# Patient Record
Sex: Female | Born: 1935 | Race: White | Hispanic: No | Smoking: Never smoker
Health system: Southern US, Community
[De-identification: ages and names within clinical notes are randomized; demographics above are authoritative.]

## PROBLEM LIST (undated history)

## (undated) DIAGNOSIS — R7989 Other specified abnormal findings of blood chemistry: Secondary | ICD-10-CM

## (undated) DIAGNOSIS — I4891 Unspecified atrial fibrillation: Secondary | ICD-10-CM

## (undated) DIAGNOSIS — R413 Other amnesia: Secondary | ICD-10-CM

## (undated) DIAGNOSIS — E876 Hypokalemia: Secondary | ICD-10-CM

## (undated) DIAGNOSIS — Z85038 Personal history of other malignant neoplasm of large intestine: Secondary | ICD-10-CM

## (undated) DIAGNOSIS — R778 Other specified abnormalities of plasma proteins: Secondary | ICD-10-CM

## (undated) DIAGNOSIS — I10 Essential (primary) hypertension: Secondary | ICD-10-CM

## (undated) HISTORY — PX: COLON SURGERY: SHX602

---

## 2006-10-11 ENCOUNTER — Encounter: Admission: RE | Admit: 2006-10-11 | Discharge: 2006-10-11 | Payer: Self-pay | Admitting: Internal Medicine

## 2006-10-31 ENCOUNTER — Encounter: Admission: RE | Admit: 2006-10-31 | Discharge: 2006-10-31 | Payer: Self-pay | Admitting: Internal Medicine

## 2007-02-07 ENCOUNTER — Encounter: Admission: RE | Admit: 2007-02-07 | Discharge: 2007-02-07 | Payer: Self-pay | Admitting: Internal Medicine

## 2007-02-26 ENCOUNTER — Encounter: Admission: RE | Admit: 2007-02-26 | Discharge: 2007-02-26 | Payer: Self-pay | Admitting: Obstetrics and Gynecology

## 2007-05-03 ENCOUNTER — Encounter: Admission: RE | Admit: 2007-05-03 | Discharge: 2007-05-03 | Payer: Self-pay | Admitting: Specialist

## 2007-09-12 ENCOUNTER — Encounter: Admission: RE | Admit: 2007-09-12 | Discharge: 2007-09-12 | Payer: Self-pay | Admitting: Obstetrics and Gynecology

## 2010-02-20 ENCOUNTER — Inpatient Hospital Stay (HOSPITAL_COMMUNITY): Admission: EM | Admit: 2010-02-20 | Discharge: 2010-02-24 | Payer: Self-pay | Admitting: Emergency Medicine

## 2010-11-14 ENCOUNTER — Encounter: Payer: Self-pay | Admitting: Obstetrics and Gynecology

## 2011-01-11 LAB — CBC
HCT: 29.7 % — ABNORMAL LOW (ref 36.0–46.0)
HCT: 38.9 % (ref 36.0–46.0)
Hemoglobin: 11.8 g/dL — ABNORMAL LOW (ref 12.0–15.0)
Hemoglobin: 9.8 g/dL — ABNORMAL LOW (ref 12.0–15.0)
Hemoglobin: 9.9 g/dL — ABNORMAL LOW (ref 12.0–15.0)
MCHC: 33.4 g/dL (ref 30.0–36.0)
MCHC: 33.7 g/dL (ref 30.0–36.0)
MCHC: 33.8 g/dL (ref 30.0–36.0)
MCV: 97 fL (ref 78.0–100.0)
MCV: 97.5 fL (ref 78.0–100.0)
MCV: 97 fL (ref 78.0–100.0)
Platelets: 165 10*3/uL (ref 150–400)
Platelets: 190 10*3/uL (ref 150–400)
Platelets: 196 10*3/uL (ref 150–400)
RBC: 2.98 MIL/uL — ABNORMAL LOW (ref 3.87–5.11)
RBC: 3.07 MIL/uL — ABNORMAL LOW (ref 3.87–5.11)
RBC: 3.15 MIL/uL — ABNORMAL LOW (ref 3.87–5.11)
RBC: 4.01 MIL/uL (ref 3.87–5.11)
RDW: 12.9 % (ref 11.5–15.5)
RDW: 13.3 % (ref 11.5–15.5)
WBC: 6.4 10*3/uL (ref 4.0–10.5)
WBC: 8 10*3/uL (ref 4.0–10.5)
WBC: 8.1 10*3/uL (ref 4.0–10.5)
WBC: 7.8 10*3/uL (ref 4.0–10.5)

## 2011-01-11 LAB — URINALYSIS, ROUTINE W REFLEX MICROSCOPIC
Bilirubin Urine: NEGATIVE
Nitrite: NEGATIVE
Urobilinogen, UA: 0.2 mg/dL (ref 0.0–1.0)

## 2011-01-11 LAB — PHOSPHORUS: Phosphorus: 3.8 mg/dL (ref 2.3–4.6)

## 2011-01-11 LAB — BASIC METABOLIC PANEL
BUN: 11 mg/dL (ref 6–23)
BUN: 14 mg/dL (ref 6–23)
BUN: 9 mg/dL (ref 6–23)
CO2: 27 mEq/L (ref 19–32)
CO2: 29 mEq/L (ref 19–32)
CO2: 25 mEq/L (ref 19–32)
Calcium: 7.9 mg/dL — ABNORMAL LOW (ref 8.4–10.5)
Calcium: 8.3 mg/dL — ABNORMAL LOW (ref 8.4–10.5)
Calcium: 8.4 mg/dL (ref 8.4–10.5)
Calcium: 8.6 mg/dL (ref 8.4–10.5)
Calcium: 8.8 mg/dL (ref 8.4–10.5)
Chloride: 104 mEq/L (ref 96–112)
Chloride: 104 mEq/L (ref 96–112)
Chloride: 105 mEq/L (ref 96–112)
Chloride: 103 mEq/L (ref 96–112)
Creatinine, Ser: 0.44 mg/dL (ref 0.4–1.2)
Creatinine, Ser: 0.47 mg/dL (ref 0.4–1.2)
Creatinine, Ser: 0.64 mg/dL (ref 0.4–1.2)
Creatinine, Ser: 0.66 mg/dL (ref 0.4–1.2)
GFR calc Af Amer: >60 mL/min (ref >60)
GFR calc Af Amer: >60 mL/min (ref >60)
GFR calc Af Amer: >60 mL/min (ref >60)
GFR calc non Af Amer: >60 mL/min (ref >60)
GFR calc non Af Amer: >60 mL/min (ref >60)
Glucose, Bld: 119 mg/dL — ABNORMAL HIGH (ref 70–99)
Glucose, Bld: 152 mg/dL — ABNORMAL HIGH (ref 70–99)
Glucose, Bld: 125 mg/dL — ABNORMAL HIGH (ref 70–99)
Potassium: 3.7 mEq/L (ref 3.5–5.1)
Sodium: 137 mEq/L (ref 135–145)
Sodium: 139 mEq/L (ref 135–145)
Sodium: 136 mEq/L (ref 135–145)

## 2011-01-11 LAB — DIFFERENTIAL
Eosinophils Absolute: 0.1 10*3/uL (ref 0.0–0.7)
Lymphs Abs: 0.7 10*3/uL (ref 0.7–4.0)
Monocytes Relative: 6 % (ref 3–12)
Neutrophils Relative %: 84 % — ABNORMAL HIGH (ref 43–77)

## 2011-01-11 LAB — URINE CULTURE: Colony Count: NO GROWTH

## 2011-01-11 LAB — LIPID PANEL
Cholesterol: 156 mg/dL (ref 0–200)
HDL: 70 mg/dL (ref >39)
LDL Cholesterol: 75 mg/dL (ref 0–99)
Total CHOL/HDL Ratio: 2.2 RATIO
Triglycerides: 55 mg/dL (ref <150)

## 2018-01-21 ENCOUNTER — Emergency Department (HOSPITAL_COMMUNITY): Payer: Medicare Other

## 2018-01-21 ENCOUNTER — Encounter (HOSPITAL_COMMUNITY): Payer: Self-pay | Admitting: Internal Medicine

## 2018-01-21 ENCOUNTER — Inpatient Hospital Stay (HOSPITAL_COMMUNITY)
Admission: EM | Admit: 2018-01-21 | Discharge: 2018-01-25 | DRG: 309 | Disposition: A | Discharge status: Skilled Nursing Facility | Payer: Medicare Other | Attending: Family Medicine | Admitting: Family Medicine

## 2018-01-21 ENCOUNTER — Other Ambulatory Visit: Payer: Self-pay

## 2018-01-21 DIAGNOSIS — M545 Low back pain: Secondary | ICD-10-CM | POA: Diagnosis present

## 2018-01-21 DIAGNOSIS — Y92009 Unspecified place in unspecified non-institutional (private) residence as the place of occurrence of the external cause: Secondary | ICD-10-CM

## 2018-01-21 DIAGNOSIS — I10 Essential (primary) hypertension: Secondary | ICD-10-CM | POA: Diagnosis not present

## 2018-01-21 DIAGNOSIS — Z85038 Personal history of other malignant neoplasm of large intestine: Secondary | ICD-10-CM | POA: Diagnosis not present

## 2018-01-21 DIAGNOSIS — H9190 Unspecified hearing loss, unspecified ear: Secondary | ICD-10-CM | POA: Diagnosis present

## 2018-01-21 DIAGNOSIS — Z66 Do not resuscitate: Secondary | ICD-10-CM | POA: Diagnosis present

## 2018-01-21 DIAGNOSIS — N390 Urinary tract infection, site not specified: Secondary | ICD-10-CM | POA: Diagnosis present

## 2018-01-21 DIAGNOSIS — W19XXXA Unspecified fall, initial encounter: Secondary | ICD-10-CM | POA: Diagnosis present

## 2018-01-21 DIAGNOSIS — Z9181 History of falling: Secondary | ICD-10-CM

## 2018-01-21 DIAGNOSIS — R531 Weakness: Secondary | ICD-10-CM

## 2018-01-21 DIAGNOSIS — K5900 Constipation, unspecified: Secondary | ICD-10-CM | POA: Diagnosis not present

## 2018-01-21 DIAGNOSIS — Z91013 Allergy to seafood: Secondary | ICD-10-CM

## 2018-01-21 DIAGNOSIS — R001 Bradycardia, unspecified: Secondary | ICD-10-CM | POA: Diagnosis not present

## 2018-01-21 DIAGNOSIS — I4891 Unspecified atrial fibrillation: Principal | ICD-10-CM | POA: Diagnosis present

## 2018-01-21 DIAGNOSIS — F0390 Unspecified dementia without behavioral disturbance: Secondary | ICD-10-CM | POA: Diagnosis present

## 2018-01-21 DIAGNOSIS — Z9049 Acquired absence of other specified parts of digestive tract: Secondary | ICD-10-CM | POA: Diagnosis not present

## 2018-01-21 DIAGNOSIS — E876 Hypokalemia: Secondary | ICD-10-CM | POA: Diagnosis present

## 2018-01-21 HISTORY — DX: Essential (primary) hypertension: I10

## 2018-01-21 HISTORY — DX: Other amnesia: R41.3

## 2018-01-21 HISTORY — DX: Personal history of other malignant neoplasm of large intestine: Z85.038

## 2018-01-21 LAB — URINALYSIS, ROUTINE W REFLEX MICROSCOPIC
Bilirubin Urine: NEGATIVE
GLUCOSE, UA: NEGATIVE mg/dL
Ketones, ur: NEGATIVE mg/dL
Leukocytes, UA: NEGATIVE
Nitrite: POSITIVE — AB
PROTEIN: NEGATIVE mg/dL
SPECIFIC GRAVITY, URINE: 1.005 (ref 1.005–1.030)
pH: 6 (ref 5.0–8.0)

## 2018-01-21 LAB — CBC
HEMATOCRIT: 42.9 % (ref 36.0–46.0)
Hemoglobin: 14.7 g/dL (ref 12.0–15.0)
MCH: 32.1 pg (ref 26.0–34.0)
MCHC: 34.3 g/dL (ref 30.0–36.0)
MCV: 93.7 fL (ref 78.0–100.0)
Platelets: 215 10*3/uL (ref 150–400)
RBC: 4.58 MIL/uL (ref 3.87–5.11)
RDW: 12.9 % (ref 11.5–15.5)
WBC: 8.3 10*3/uL (ref 4.0–10.5)

## 2018-01-21 LAB — BASIC METABOLIC PANEL
Anion gap: 10 (ref 5–15)
BUN: 15 mg/dL (ref 6–20)
CALCIUM: 9.2 mg/dL (ref 8.9–10.3)
CO2: 27 mmol/L (ref 22–32)
Chloride: 102 mmol/L (ref 101–111)
Creatinine, Ser: 0.55 mg/dL (ref 0.44–1.00)
GFR calc Af Amer: >60 mL/min (ref >60)
GFR calc non Af Amer: >60 mL/min (ref >60)
GLUCOSE: 123 mg/dL — AB (ref 65–99)
Potassium: 3.2 mmol/L — ABNORMAL LOW (ref 3.5–5.1)
Sodium: 139 mmol/L (ref 135–145)

## 2018-01-21 LAB — I-STAT TROPONIN, ED: Troponin i, poc: 0.02 ng/mL (ref 0.00–0.08)

## 2018-01-21 LAB — MAGNESIUM: Magnesium: 2 mg/dL (ref 1.7–2.4)

## 2018-01-21 MED ORDER — ACETAMINOPHEN 650 MG RE SUPP: 650.0000 mg | Freq: Four times a day (QID) | RECTAL | Status: DC | PRN

## 2018-01-21 MED ORDER — HALOPERIDOL LACTATE 5 MG/ML IJ SOLN
5.0000 mg | Freq: Four times a day (QID) | INTRAMUSCULAR | Status: DC | PRN
  Administered 2018-01-21 – 2018-01-23 (×3): 5 mg via INTRAVENOUS
  Filled 2018-01-21 (×4): qty 1

## 2018-01-21 MED ORDER — CEFTRIAXONE SODIUM 1 G IJ SOLR
1.0000 g | INTRAMUSCULAR | Status: DC
  Administered 2018-01-22: 1 g via INTRAVENOUS
  Filled 2018-01-21: qty 10
  Filled 2018-01-21: qty 1

## 2018-01-21 MED ORDER — BENAZEPRIL HCL 10 MG PO TABS
20.0000 mg | ORAL_TABLET | Freq: Every day | ORAL | Status: DC
  Administered 2018-01-22 – 2018-01-25 (×4): 20 mg via ORAL
  Filled 2018-01-21: qty 1
  Filled 2018-01-21 (×3): qty 2

## 2018-01-21 MED ORDER — ACETAMINOPHEN 325 MG PO TABS: 650.0000 mg | ORAL_TABLET | Freq: Four times a day (QID) | ORAL | Status: DC | PRN

## 2018-01-21 MED ORDER — POTASSIUM CHLORIDE CRYS ER 20 MEQ PO TBCR
40.0000 meq | EXTENDED_RELEASE_TABLET | Freq: Once | ORAL | Status: AC
  Administered 2018-01-21: 40 meq via ORAL
  Filled 2018-01-21: qty 2

## 2018-01-21 MED ORDER — AMLODIPINE BESYLATE 10 MG PO TABS
10.0000 mg | ORAL_TABLET | Freq: Every day | ORAL | Status: DC
  Administered 2018-01-22 – 2018-01-23 (×2): 10 mg via ORAL
  Filled 2018-01-21 (×2): qty 1

## 2018-01-21 MED ORDER — LORAZEPAM 2 MG/ML IJ SOLN
0.5000 mg | Freq: Once | INTRAMUSCULAR | Status: AC
  Administered 2018-01-22: 0.5 mg via INTRAVENOUS
  Filled 2018-01-21: qty 1

## 2018-01-21 MED ORDER — ONDANSETRON HCL 4 MG PO TABS: 4.0000 mg | ORAL_TABLET | Freq: Four times a day (QID) | ORAL | Status: DC | PRN

## 2018-01-21 MED ORDER — ENOXAPARIN SODIUM 40 MG/0.4ML ~~LOC~~ SOLN
40.0000 mg | SUBCUTANEOUS | Status: DC
  Administered 2018-01-21 – 2018-01-24 (×4): 40 mg via SUBCUTANEOUS
  Filled 2018-01-21 (×4): qty 0.4

## 2018-01-21 MED ORDER — DILTIAZEM LOAD VIA INFUSION
5.0000 mg | Freq: Once | INTRAVENOUS | Status: AC
  Administered 2018-01-21: 5 mg via INTRAVENOUS
  Filled 2018-01-21: qty 5

## 2018-01-21 MED ORDER — SODIUM CHLORIDE 0.9 % IV SOLN
1.0000 g | Freq: Once | INTRAVENOUS | Status: AC
  Administered 2018-01-21: 1 g via INTRAVENOUS
  Filled 2018-01-21: qty 10

## 2018-01-21 MED ORDER — ONDANSETRON HCL 4 MG/2ML IJ SOLN: 4.0000 mg | Freq: Four times a day (QID) | INTRAMUSCULAR | Status: DC | PRN

## 2018-01-21 MED ORDER — DILTIAZEM HCL-DEXTROSE 100-5 MG/100ML-% IV SOLN (PREMIX)
5.0000 mg/h | INTRAVENOUS | Status: DC
  Administered 2018-01-21: 5 mg/h via INTRAVENOUS
  Filled 2018-01-21: qty 100

## 2018-01-21 NOTE — ED Provider Notes (Addendum)
[facility]-EMERGENCY DEPT Provider Note   CSN: [id number] Arrival date & time: 01/21/18  1208     History   Chief Complaint Chief Complaint  Patient presents with  . Back Pain    HPI Peggy Webb is a [age] y.o. female.  HPI   Peggy Webb is a [age]yo female with a history of HTN, hard of hearing and memory loss who presents to the emergency department with her daughter for evaluation of lower back pain. Level 5 caveat applies given patient's memory loss. Per family member patient was found in the middle of the night three days ago after she had presumably missed the bed and fell on to her buttocks. She was complaining of right hip pain and her doctor made a house call and got xrays of the right hip and leg which were negative. The next day patient began complaining of lower back pain, particularly in the morning. She is able to ambulate with a walker despite her pain. They have been giving her motrin for it. Family state that they would like patient checked for a UTI because she seems to be more irritated and fussy today and this has happened in the past with UTI. They deny any recent URI illness. No fever, cough, congestion, vomiting.   No past medical history on file.  There are no active problems to display for this patient.   Past Surgical History:  Procedure Laterality Date  . COLON SURGERY     partial colectomy due to colon cancer > 15-20 years ago     OB History   None      Home Medications    Prior to Admission medications   Not on File    Family History No family history on file.  Social History Social History   Tobacco Use  . Smoking status: Not on file  Substance Use Topics  . Alcohol use: Not on file  . Drug use: Not on file     Allergies   Patient has no allergy information on record.   Review of Systems Review of Systems  Unable to perform ROS: Other     Physical Exam Updated Vital Signs BP 109/79 (BP Location:  Left Arm)   Pulse (!) 121   Temp 98.4 F (36.9 C) (Oral)   Resp (!) 21   Ht 5\' 6"  (1.676 m)   Wt 60.3 kg (132 lb 15 oz)   SpO2 96%   BMI 21.46 kg/m   Physical Exam  Constitutional: She appears well-developed and well-nourished. No distress.  Sitting at bedside in no apparent distress.   HENT:  Head: Normocephalic and atraumatic.  Mouth/Throat: Oropharynx is clear and moist. No oropharyngeal exudate.  Eyes: Pupils are equal, round, and reactive to light. Conjunctivae are normal. Right eye exhibits no discharge. Left eye exhibits no discharge.  Neck: Normal range of motion. Neck supple.  No midline cervical spine tenderness.  Cardiovascular: Intact distal pulses.  No murmur heard. Irregularly irregular rhythm, tachycardic.   Pulmonary/Chest: Effort normal and breath sounds normal. No stridor. No respiratory distress. She has no wheezes. She has no rales.  Abdominal: Soft. Bowel sounds are normal. There is no tenderness.  Musculoskeletal:  Tender to palpation over several spinous processes of the lumbar spine.   Neurological: She is alert. Coordination normal.  No apparent aphasia or facial droop. PERRL. Moving all extremities.   Skin: Skin is warm and dry. Capillary refill takes less than 2 seconds. She is not diaphoretic.  Psychiatric: She has a normal mood and affect. Her behavior is normal.  Nursing note and vitals reviewed.    ED Treatments / Results  Labs (all labs ordered are listed, but only abnormal results are displayed) Labs Reviewed  URINALYSIS, ROUTINE W REFLEX MICROSCOPIC - Abnormal; Notable for the following components:      Result Value   Hgb urine dipstick SMALL (*)    Nitrite POSITIVE (*)    Bacteria, UA MANY (*)    Squamous Epithelial / LPF 0-5 (*)    All other components within normal limits  BASIC METABOLIC PANEL - Abnormal; Notable for the following components:   Potassium 3.2 (*)    Glucose, Bld 123 (*)    All other components within normal limits    URINE CULTURE  CBC  MAGNESIUM  I-STAT TROPONIN, ED    EKG None  Radiology Dg Chest 2 View  Result Date: 01/21/2018 CLINICAL DATA:  Shortness of breath. EXAM: CHEST - 2 VIEW COMPARISON:  Radiograph of February 20, 2010. FINDINGS: Stable cardiomegaly. Atherosclerosis of thoracic aorta is noted. Stable calcified left hilar lymph node. No pneumothorax or pleural effusion is noted. No acute pulmonary disease is noted. Bony thorax is unremarkable. IMPRESSION: No active cardiopulmonary disease. Aortic Atherosclerosis (ICD10-I70.0). Electronically Signed   By: Lupita Raider, M.D.   On: 01/21/2018 17:06   Dg Lumbar Spine Complete  Result Date: 01/21/2018 CLINICAL DATA:  Acute low back pain after fall 2 days ago. EXAM: LUMBAR SPINE - COMPLETE 4+ VIEW COMPARISON:  CT scan of October 11, 2006. FINDINGS: Diffuse osteopenia is noted. No spondylolisthesis is noted. Moderate superior endplate depression of L3 vertebral body is noted most consistent with old fracture. Moderate degenerative disc disease is noted at L4-5 and L5-S1. Degenerative changes seen involving posterior facet joints of L5-S1. IMPRESSION: Probable old L3 vertebral body fracture is noted. Multilevel degenerative disc disease. No acute abnormality seen in the lumbar spine. Electronically Signed   By: Lupita Raider, M.D.   On: 01/21/2018 14:42    Procedures Procedures (including critical care time)  CRITICAL CARE Performed by: Kellie Shropshire   Total critical care time: 35 minutes  Critical care time was exclusive of separately billable procedures and treating other patients.  Critical care was necessary to treat or prevent imminent or life-threatening deterioration.  Critical care was time spent personally by me on the following activities: development of treatment plan with patient and/or surrogate as well as nursing, discussions with consultants, evaluation of patient's response to treatment, examination of patient,  obtaining history from patient or surrogate, ordering and performing treatments and interventions, ordering and review of laboratory studies, ordering and review of radiographic studies, pulse oximetry and re-evaluation of patient's condition.   Medications Ordered in ED Medications  cefTRIAXone (ROCEPHIN) 1 g in sodium chloride 0.9 % 100 mL IVPB (has no administration in time range)  potassium chloride SA (K-DUR,KLOR-CON) CR tablet 40 mEq (has no administration in time range)  diltiazem (CARDIZEM) 1 mg/mL load via infusion 5 mg (has no administration in time range)    And  diltiazem (CARDIZEM) 100 mg in dextrose 5% (1 mg/mL) infusion (has no administration in time range)     Initial Impression / Assessment and Plan / ED Course  I have reviewed the triage vital signs and the nursing notes.  Pertinent labs & imaging results that were available during my care of the patient were reviewed by me and considered in my medical decision making (see chart  for details).     Lumbar Xray with old L3 vertebral body fracture, no acute fractures noted. Patient was noted to be tachycardic with irregularly irregular rhythm on exam. Family at bedside denies history of Afib in the past. Plan to get EKG, troponin and CXR for further evaluation. Will also get basic labs and urine given report of patient being somewhat more fussy and altered than her baseline.   CBC unremarkable. BMP reveals mild hypokalemia (K+ 3.2). UA with positive nitrite with many bacteria, although low leukocytes. Borderline infection vs contamination. Culture sent. EKG reveals Afib with RVR. Patient is otherwise hemodynamically stable. Troponin negative, patient denies CP. Do not suspect ACS at this time given presentation and lab work. CXR without acute abnormality.  Discussed this patient with Dr. Alvino Chapel who will admit patient for new Afib with RVR. Patient started on Cardizem drip in the ED and also given Rocephin for UTI. This was a  shared visit with Dr. Rosalia Hammers who also saw patient and agrees with above plan and admission. Family informed.  Final Clinical Impressions(s) / ED Diagnoses   Final diagnoses:  None    ED Discharge Orders    None       Kellie Shropshire, PA-C 01/21/18 2350    Kellie Shropshire, PA-C 01/22/18 0020    Margarita Grizzle, MD 01/24/18 [phone]

## 2018-01-21 NOTE — ED Notes (Signed)
ED TO INPATIENT HANDOFF REPORT  Name/Age/Gender Peggy Webb 84 y.o. female  Code Status   Home/SNF/Other Home  Chief Complaint back pain  Level of Care/Admitting Diagnosis ED Disposition    ED Disposition Condition [facility] Area: [place] [[id number]]  Level of Care: Stepdown [14]  Admit to SDU based on following criteria: Cardiac Instability:  Patients experiencing chest pain, unconfirmed MI and stable, arrhythmias and CHF requiring medical management and potentially compromising patient's stability  Diagnosis: Atrial fibrillation with RVR [facility]) [[id number]]  Admitting Physician: Dessa Phi [[id number]]  Attending Physician: Dessa Phi [phone]  Estimated length of stay: past midnight tomorrow  Certification:: I certify this patient will need inpatient services for at least 2 midnights  PT Class (Do Not Modify): Inpatient [101]  PT Acc Code (Do Not Modify): Private [1]       Medical History Past Medical History:  Diagnosis Date  . History of colon cancer   . HTN (hypertension)   . Memory loss     Allergies Allergies  Allergen Reactions  . Shrimp [Shellfish Allergy] Nausea And Vomiting    IV Location/Drains/Wounds Patient Lines/Drains/Airways Status   Active Line/Drains/Airways    Name:   Placement date:   Placement time:   Site:   Days:   Peripheral IV 01/21/18 Left;Lateral Forearm   01/21/18    1815    Forearm   less than 1          Labs/Imaging Results for orders placed or performed during the hospital encounter of 01/21/18 (from the past 48 hour(s))  Urinalysis, Routine w reflex microscopic     Status: Abnormal   Collection Time: 01/21/18  3:19 PM  Result Value Ref Range   Color, Urine YELLOW YELLOW   APPearance CLEAR CLEAR   Specific Gravity, Urine 1.005 1.005 - 1.030   pH 6.0 5.0 - 8.0   Glucose, UA NEGATIVE NEGATIVE mg/dL   Hgb urine dipstick SMALL (A) NEGATIVE   Bilirubin Urine NEGATIVE NEGATIVE    Ketones, ur NEGATIVE NEGATIVE mg/dL   Protein, ur NEGATIVE NEGATIVE mg/dL   Nitrite POSITIVE (A) NEGATIVE   Leukocytes, UA NEGATIVE NEGATIVE   RBC / HPF 0-5 0 - 5 RBC/hpf   WBC, UA 0-5 0 - 5 WBC/hpf   Bacteria, UA MANY (A) NONE SEEN   Squamous Epithelial / LPF 0-5 (A) NONE SEEN    Comment: Performed at [facility], [place] [address] [place], [place] [address]  CBC     Status: None   Collection Time: 01/21/18  3:19 PM  Result Value Ref Range   WBC 8.3 4.0 - 10.5 K/uL   RBC 4.58 3.87 - 5.11 MIL/uL   Hemoglobin 14.7 12.0 - 15.0 g/dL   HCT 42.9 36.0 - 46.0 %   MCV 93.7 78.0 - 100.0 fL   MCH 32.1 26.0 - 34.0 pg   MCHC 34.3 30.0 - 36.0 g/dL   RDW 12.9 11.5 - 15.5 %   Platelets 215 150 - 400 K/uL    Comment: Performed at [facility], [place] [address], [place], [place] [address]  Basic metabolic panel     Status: Abnormal   Collection Time: 01/21/18  3:19 PM  Result Value Ref Range   Sodium 139 135 - 145 mmol/L   Potassium 3.2 (L) 3.5 - 5.1 mmol/L   Chloride 102 101 - 111 mmol/L   CO2 27 22 - 32 mmol/L   Glucose, Bld 123 (H) 65 -  99 mg/dL   BUN 15 6 - 20 mg/dL   Creatinine, Ser 0.55 0.44 - 1.00 mg/dL   Calcium 9.2 8.9 - 10.3 mg/dL   GFR calc non Af Amer >60 >60 mL/min   GFR calc Af Amer >60 >60 mL/min    Comment: (NOTE) The eGFR has been calculated using the CKD EPI equation. This calculation has not been validated in all clinical situations. eGFR's persistently <60 mL/min signify possible Chronic Kidney Disease.    Anion gap 10 5 - 15    Comment: Performed at [facility], [place] [address] [place], [place] [address]  Magnesium     Status: None   Collection Time: 01/21/18  3:20 PM  Result Value Ref Range   Magnesium 2.0 1.7 - 2.4 mg/dL    Comment: Performed at [facility], [place] [address] [place], [place] [address]  I-Stat Troponin, ED (not at [facility])     Status: None   Collection Time: 01/21/18  4:55 PM   Result Value Ref Range   Troponin i, poc 0.02 0.00 - 0.08 ng/mL   Comment 3            Comment: Due to the release kinetics of cTnI, a negative result within the first hours of the onset of symptoms does not rule out myocardial infarction with certainty. If myocardial infarction is still suspected, repeat the test at appropriate intervals.    Dg Chest 2 View  Result Date: 01/21/2018 CLINICAL DATA:  Shortness of breath. EXAM: CHEST - 2 VIEW COMPARISON:  Radiograph of February 20, 2010. FINDINGS: Stable cardiomegaly. Atherosclerosis of thoracic aorta is noted. Stable calcified left hilar lymph node. No pneumothorax or pleural effusion is noted. No acute pulmonary disease is noted. Bony thorax is unremarkable. IMPRESSION: No active cardiopulmonary disease. Aortic Atherosclerosis (ICD10-I70.0). Electronically Signed   By: Marijo Conception, M.D.   On: 01/21/2018 17:06   Dg Lumbar Spine Complete  Result Date: 01/21/2018 CLINICAL DATA:  Acute low back pain after fall 2 days ago. EXAM: LUMBAR SPINE - COMPLETE 4+ VIEW COMPARISON:  CT scan of October 11, 2006. FINDINGS: Diffuse osteopenia is noted. No spondylolisthesis is noted. Moderate superior endplate depression of L3 vertebral body is noted most consistent with old fracture. Moderate degenerative disc disease is noted at L4-5 and L5-S1. Degenerative changes seen involving posterior facet joints of L5-S1. IMPRESSION: Probable old L3 vertebral body fracture is noted. Multilevel degenerative disc disease. No acute abnormality seen in the lumbar spine. Electronically Signed   By: Marijo Conception, M.D.   On: 01/21/2018 14:42    Pending Labs Unresulted Labs (From admission, onward)   Start     Ordered   01/21/18 1401  Urine culture  STAT,   STAT     01/21/18 1400   Signed and Held  CBC  (enoxaparin (LOVENOX)    CrCl >/= 30 ml/min)  Once,   R    Comments:  Baseline for enoxaparin therapy IF NOT ALREADY DRAWN.  Notify MD if PLT < 100 K.    Signed and  Held   Signed and Held  Creatinine, serum  (enoxaparin (LOVENOX)    CrCl >/= 30 ml/min)  Once,   R    Comments:  Baseline for enoxaparin therapy IF NOT ALREADY DRAWN.    Signed and Held   Signed and Held  Creatinine, serum  (enoxaparin (LOVENOX)    CrCl >/= 30 ml/min)  Weekly,   R    Comments:  while on enoxaparin therapy    Signed and Held   Signed and Held  TSH  Once,   R     Signed and Held   Signed and Held  CBC  Tomorrow morning,   R     Signed and Held   Signed and Held  Basic metabolic panel  Tomorrow morning,   R     Signed and Held      Vitals/Pain Today's Vitals   01/21/18 1730 01/21/18 1800 01/21/18 1830 01/21/18 1845  BP:    109/79  Pulse: 90 (!) 147 89 (!) 121  Resp: (!) 24 20 (!) 25 (!) 21  SpO2: 99% 97% 97% 96%    Isolation Precautions No active isolations  Medications Medications  potassium chloride SA (K-DUR,KLOR-CON) CR tablet 40 mEq (has no administration in time range)  diltiazem (CARDIZEM) 1 mg/mL load via infusion 5 mg (5 mg Intravenous Bolus from Bag 01/21/18 1820)    And  diltiazem (CARDIZEM) 100 mg in dextrose 5% 1110m (1 mg/mL) infusion (5 mg/hr Intravenous New Bag/Given 01/21/18 1821)  cefTRIAXone (ROCEPHIN) 1 g in sodium chloride 0.9 % 100 mL IVPB (1 g Intravenous New Bag/Given 01/21/18 1817)    Mobility walks with device

## 2018-01-21 NOTE — ED Notes (Signed)
Bed: [id number] Expected date:  Expected time:  Means of arrival:  Comments: [age] yo fall

## 2018-01-21 NOTE — ED Triage Notes (Signed)
She is here with one of her daughters. Her daughter tells us that pt. Larey SeatFell two days ago (Thursday); and landed on her buttocks. She was seen at hospital and had x-rays (of extremities, not of her back) at that time. She is here today for lumbar area back pain which began yesterday and persists. Her daughter tells us pt. Has been able to ambulate with her walker after this fall. Pt. Is in no distress.

## 2018-01-21 NOTE — H&P (Signed)
History and Physical    BIRDELL FRASIER WJX:[id number] DOB: Jan 23, 1914 DOA: 01/21/2018  PCP: Angela Cox, MD  Patient coming from: Home  Chief Complaint: Low back pain after fall at home  HPI: Peggy Webb is a 84 y.o. female with medical history significant of HTN, hx colon cancer s/p partial colectomy in remission, memory loss who presents with low back pain. She had fallen/slid down at home a few days ago at the edge of the bed. She had an outpatient hip xray which was negative. She was initially doing well, walking with a walker but then continued to complain of low back pain and family felt she was acting more irritable/confused than normal so she was brought in to the hospital. She has memory loss although no formal diagnosis of dementia. She lives at home with daughter and son-in-law. She has no complaints on my exam; denies chest pain, shortness of breath, abdominal pain, back pain, nausea, vomiting, dysuria.   ED Course: EKG revealed new onset A Fib. UA positive for nitrite, many bacteria, 0-5 WBC. Due to A Fib RVR rate 100-140s, she was referred for admission. She is started on IV rocephin for UTI while awaiting urine culture result and IV cardizem for rate control.   Review of Systems: As per HPI otherwise 10 point review of systems negative.   Past Medical History:  Diagnosis Date  . History of colon cancer   . HTN (hypertension)   . Memory loss     Past Surgical History:  Procedure Laterality Date  . COLON SURGERY     partial colectomy due to colon cancer > 15-20 years ago     has no tobacco, alcohol, and drug history on file.  Allergies  Allergen Reactions  . Shrimp [Shellfish Allergy] Nausea And Vomiting    History reviewed. No pertinent family history.    Prior to Admission medications   Medication Sig Start Date End Date Taking? Authorizing Provider  amLODipine (NORVASC) 10 MG tablet Take 10 mg by mouth daily. 11/06/17  Yes [provider]  benazepril (LOTENSIN) 20 MG tablet Take 20 mg by mouth daily. 11/13/17  Yes [provider]  ibuprofen (ADVIL,MOTRIN) 200 MG tablet Take 400-800 mg by mouth 2 (two) times daily as needed for moderate pain.   Yes [provider]  labetalol (NORMODYNE) 200 MG tablet Take 100 mg by mouth 2 (two) times daily.   Yes [provider]    Physical Exam: Vitals:   01/21/18 1645 01/21/18 1730 01/21/18 1800 01/21/18 1830  BP:      Pulse: (!) 55 90 (!) 147 89  Resp: (!) 23 (!) 24 20 (!) 25  SpO2: 100% 99% 97% 97%    Constitutional: NAD, calm, comfortable Eyes: PERRL, lids and conjunctivae normal ENMT: Mucous membranes are moist. Posterior pharynx clear of any exudate or lesions.Normal dentition. +Hard of hearing  Neck: normal, supple, no masses, no thyromegaly Respiratory: clear to auscultation bilaterally, no wheezing, no crackles. Normal respiratory effort. No accessory muscle use.  Cardiovascular: Irregular rhythm, rate 130s, no murmurs / rubs / gallops. No extremity edema. Abdomen: no tenderness, no masses palpated. No hepatosplenomegaly. Bowel sounds positive.  Musculoskeletal: no clubbing / cyanosis. No joint deformity upper and lower extremities. Skin: no rashes, lesions, ulcers. No induration Neurologic: CN 2-12 grossly intact. Strength 5/5 in all 4.  Psychiatric: Alert. +Memory loss  Labs on Admission: I have personally reviewed following labs and imaging studies  CBC: Recent Labs  Lab 01/21/18  1519  WBC 8.3  HGB 14.7  HCT 42.9  MCV 93.7  PLT 215   Basic Metabolic Panel: Recent Labs  Lab 01/21/18 1519 01/21/18 1520  NA 139  --   K 3.2*  --   CL 102  --   CO2 27  --   GLUCOSE 123*  --   BUN 15  --   CREATININE 0.55  --   CALCIUM 9.2  --   MG  --  2.0   GFR: CrCl cannot be calculated (Unknown ideal weight.). Liver Function Tests: No results for input(s): AST, ALT, ALKPHOS, BILITOT, PROT, ALBUMIN in the last 168 hours. No results for  input(s): LIPASE, AMYLASE in the last 168 hours. No results for input(s): AMMONIA in the last 168 hours. Coagulation Profile: No results for input(s): INR, PROTIME in the last 168 hours. Cardiac Enzymes: No results for input(s): CKTOTAL, CKMB, CKMBINDEX, TROPONINI in the last 168 hours. BNP (last 3 results) No results for input(s): PROBNP in the last 8760 hours. HbA1C: No results for input(s): HGBA1C in the last 72 hours. CBG: No results for input(s): GLUCAP in the last 168 hours. Lipid Profile: No results for input(s): CHOL, HDL, LDLCALC, TRIG, CHOLHDL, LDLDIRECT in the last 72 hours. Thyroid Function Tests: No results for input(s): TSH, T4TOTAL, FREET4, T3FREE, THYROIDAB in the last 72 hours. Anemia Panel: No results for input(s): VITAMINB12, FOLATE, FERRITIN, TIBC, IRON, RETICCTPCT in the last 72 hours. Urine analysis:    Component Value Date/Time   COLORURINE YELLOW 01/21/2018 1519   APPEARANCEUR CLEAR 01/21/2018 1519   LABSPEC 1.005 01/21/2018 1519   PHURINE 6.0 01/21/2018 1519   GLUCOSEU NEGATIVE 01/21/2018 1519   HGBUR SMALL (A) 01/21/2018 1519   BILIRUBINUR NEGATIVE 01/21/2018 1519   KETONESUR NEGATIVE 01/21/2018 1519   PROTEINUR NEGATIVE 01/21/2018 1519   UROBILINOGEN 0.2 02/20/2010 1403   NITRITE POSITIVE (A) 01/21/2018 1519   LEUKOCYTESUR NEGATIVE 01/21/2018 1519   Sepsis Labs: !!!!!!!!!!!!!!!!!!!!!!!!!!!!!!!!!!!!!!!!!!!! @LABRCNTIP (procalcitonin:4,lacticidven:4) )No results found for this or any previous visit (from the past 240 hour(s)).   Radiological Exams on Admission: Dg Chest 2 View  Result Date: 01/21/2018 CLINICAL DATA:  Shortness of breath. EXAM: CHEST - 2 VIEW COMPARISON:  Radiograph of February 20, 2010. FINDINGS: Stable cardiomegaly. Atherosclerosis of thoracic aorta is noted. Stable calcified left hilar lymph node. No pneumothorax or pleural effusion is noted. No acute pulmonary disease is noted. Bony thorax is unremarkable. IMPRESSION: No active  cardiopulmonary disease. Aortic Atherosclerosis (ICD10-I70.0). Electronically Signed   By: Lupita RaiderJames  Green Jr, M.D.   On: 01/21/2018 17:06   Dg Lumbar Spine Complete  Result Date: 01/21/2018 CLINICAL DATA:  Acute low back pain after fall 2 days ago. EXAM: LUMBAR SPINE - COMPLETE 4+ VIEW COMPARISON:  CT scan of October 11, 2006. FINDINGS: Diffuse osteopenia is noted. No spondylolisthesis is noted. Moderate superior endplate depression of L3 vertebral body is noted most consistent with old fracture. Moderate degenerative disc disease is noted at L4-5 and L5-S1. Degenerative changes seen involving posterior facet joints of L5-S1. IMPRESSION: Probable old L3 vertebral body fracture is noted. Multilevel degenerative disc disease. No acute abnormality seen in the lumbar spine. Electronically Signed   By: Lupita RaiderJames  Green Jr, M.D.   On: 01/21/2018 14:42    EKG: Independently reviewed. A Fib.   Assessment/Plan Active Problems:   Atrial fibrillation with RVR (HCC)   A Fib RVR, new onset A Fib -CHADSVASC score 4  -Cardizem gtt for rate control -Discussed with family regarding anticoagulation vs aspirin in  this 84 year old patient with fall at home. Not sure that she is a great candidate for full anticoagulation, although her risk of stroke remains high. Reviewed possibly starting baby aspirin instead. Family to discuss and let us know how they would like to proceed  -Echo -Check TSH  UTI, POA -Await urine culture -Empiric Rocephin  Recent fall at home -Lumbar xray negative for acute fracture -PT OT eval  HTN -Amlodipine, lotensin  -Will hold labetalol for now until rate better controlled with cardizem gtt, can transition to PO tx once rate controlled   Hypokalemia -Replace, trend -Check Mg    DVT prophylaxis: Lovenox Code Status: DNR, reviewed with family and HCPOA at bedside  Family Communication: Discussed at bedside Disposition Plan: Pending improvement in HR, UTI, PT OT eval  Consults  called: None  Admission status: Inpatient   * I certify that at the point of admission it is my clinical judgment that the patient will require inpatient hospital care spanning beyond 2 midnights from the point of admission due to high intensity of service, high risk for further deterioration and high frequency of surveillance required.*   Noralee Stain, DO [facility] [email or web address] Password [facility] 01/21/2018, 6:43 PM

## 2018-01-22 ENCOUNTER — Encounter (HOSPITAL_COMMUNITY): Payer: Self-pay | Admitting: *Deleted

## 2018-01-22 ENCOUNTER — Other Ambulatory Visit: Payer: Self-pay

## 2018-01-22 ENCOUNTER — Inpatient Hospital Stay (HOSPITAL_COMMUNITY): Payer: Medicare Other

## 2018-01-22 DIAGNOSIS — I4891 Unspecified atrial fibrillation: Secondary | ICD-10-CM

## 2018-01-22 LAB — BASIC METABOLIC PANEL
Anion gap: 8 (ref 5–15)
BUN: 16 mg/dL (ref 6–20)
CO2: 27 mmol/L (ref 22–32)
Calcium: 8.8 mg/dL — ABNORMAL LOW (ref 8.9–10.3)
Chloride: 105 mmol/L (ref 101–111)
Creatinine, Ser: 0.53 mg/dL (ref 0.44–1.00)
GFR calc Af Amer: >60 mL/min (ref >60)
GLUCOSE: 87 mg/dL (ref 65–99)
Potassium: 3.4 mmol/L — ABNORMAL LOW (ref 3.5–5.1)
SODIUM: 140 mmol/L (ref 135–145)

## 2018-01-22 LAB — CBC
HCT: 41.1 % (ref 36.0–46.0)
Hemoglobin: 13.8 g/dL (ref 12.0–15.0)
MCH: 31.8 pg (ref 26.0–34.0)
MCHC: 33.6 g/dL (ref 30.0–36.0)
MCV: 94.7 fL (ref 78.0–100.0)
PLATELETS: 199 10*3/uL (ref 150–400)
RBC: 4.34 MIL/uL (ref 3.87–5.11)
RDW: 12.9 % (ref 11.5–15.5)
WBC: 6.3 10*3/uL (ref 4.0–10.5)

## 2018-01-22 LAB — MRSA PCR SCREENING: MRSA BY PCR: NEGATIVE

## 2018-01-22 LAB — ECHOCARDIOGRAM COMPLETE
Height: 66 in
WEIGHTICAEL: 2127 [oz_av]

## 2018-01-22 LAB — URINE CULTURE

## 2018-01-22 LAB — TSH: TSH: 3.256 u[IU]/mL (ref 0.350–4.500)

## 2018-01-22 MED ORDER — LORAZEPAM 2 MG/ML IJ SOLN
1.0000 mg | Freq: Once | INTRAMUSCULAR | Status: AC
  Administered 2018-01-22: 1 mg via INTRAVENOUS
  Filled 2018-01-22: qty 1

## 2018-01-22 MED ORDER — POTASSIUM CHLORIDE CRYS ER 20 MEQ PO TBCR
40.0000 meq | EXTENDED_RELEASE_TABLET | Freq: Once | ORAL | Status: AC
  Administered 2018-01-22: 40 meq via ORAL
  Filled 2018-01-22: qty 2

## 2018-01-22 NOTE — Progress Notes (Signed)
Late entry: Pt was in Afib at shift change with HR in the 120-150s. NP on call notified. Pts HR came down to 90s-100s with no intervention. Will continue to monitor.

## 2018-01-22 NOTE — Progress Notes (Signed)
  Echocardiogram 2D Echocardiogram has been performed.  Peggy Webb 01/22/2018, 9:47 AM

## 2018-01-22 NOTE — Progress Notes (Signed)
OT Cancellation Note  Patient Details Name: Peggy KaufmanMargaret W Moris MRN: [id number] DOB: 01/08/1914   Cancelled Treatment:    Reason Eval/Treat Not Completed: Other (comment)  Noted pt worked with PT this day and needed significant A. Will perform OT eval next day as pt likely more mobile. Lise AuerLori Chassidy [place], [place] [phone]  Einar CrowEDDING, Ival Basquez D 01/22/2018, 4:20 PM

## 2018-01-22 NOTE — Progress Notes (Signed)
PROGRESS NOTE    Peggy Webb  ZOX:[id number] DOB: [birth year] DOA: 01/21/2018 PCP: Angela Cox, MD     Brief Narrative:  Peggy Webb is a 84 y.o. female with medical history significant of HTN, hx colon cancer s/p partial colectomy in remission, memory loss who presents with low back pain. She had fallen/slid down at home a few days ago at the edge of the bed. She had an outpatient hip xray which was negative. She was initially doing well, walking with a walker but then continued to complain of low back pain and family felt she was acting more irritable/confused than normal so she was brought in to the hospital. She has memory loss although no formal diagnosis of dementia. She lives at home with daughter and son-in-law. In the ED, EKG revealed new onset A Fib. UA positive for nitrite, many bacteria, 0-5 WBC. Due to A Fib RVR rate 100-140s, she was referred for admission. She is started on IV rocephin for UTI while awaiting urine culture result and IV cardizem for rate control.   Assessment & Plan:   Active Problems:   Atrial fibrillation with RVR (HCC)   A Fib RVR, new onset A Fib -CHADSVASC score 4. Discussed with family regarding anticoagulation vs aspirin in this 84 year old patient with fall at home. Not sure that she is a great candidate for full anticoagulation, although her risk of stroke remains high. Reviewed possibly starting baby aspirin instead.  -TSH normal -Echo pending  -Rate much better controlled, now in normal sinus rhythm rate 60s, off cardizem gtt  UTI, POA -Await urine culture -Empiric Rocephin  Delirium -In setting of infection, a fib, memory loss at baseline -Improved after haldol   Recent fall at home -Lumbar xray negative for acute fracture -PT OT eval  HTN -Amlodipine, lotensin  -Will hold labetalol for now   Hypokalemia -Replace, trend    DVT prophylaxis: Lovenox Code Status: DNR Family Communication: Daughter at  bedside Disposition Plan: Pending echocardiogram, PT eval, urine culture result   Consultants:   None  Procedures:   None   Antimicrobials:  Anti-infectives (From admission, onward)   Start     Dose/Rate Route Frequency Ordered Stop   01/22/18 1800  cefTRIAXone (ROCEPHIN) 1 g in sodium chloride 0.9 % 100 mL IVPB     1 g 200 mL/hr over 30 Minutes Intravenous Every 24 hours 01/21/18 1928     01/21/18 1800  cefTRIAXone (ROCEPHIN) 1 g in sodium chloride 0.9 % 100 mL IVPB     1 g 200 mL/hr over 30 Minutes Intravenous  Once 01/21/18 1750 01/21/18 1855        Subjective: Had a rough night, very confused and agitated. Received IV ativan and haldol and has been sleeping since then. No physical complaints otherwise per family report.   Objective: Vitals:   01/22/18 0400 01/22/18 0500 01/22/18 0600 01/22/18 0743  BP: 128/69 (!) 159/52 (!) 126/51   Pulse: 62 (!) 58 (!) 56   Resp: (!) 22 (!) 21 16   Temp: 97.8 F (36.6 C)   (!) 96.6 F (35.9 C)  TempSrc: Axillary   Axillary  SpO2: 91% 100% 100%   Weight:      Height:        Intake/Output Summary (Last 24 hours) at 01/22/2018 0840 Last data filed at 01/22/2018 0500 Gross per 24 hour  Intake 350.67 ml  Output -  Net 350.67 ml   Filed Weights   01/21/18 1929  Weight: 60.3 kg (132 lb 15 oz)    Examination:  General exam: Appears calm and comfortable, sleeping soundly Respiratory system: Clear to auscultation. Respiratory effort normal. Cardiovascular system: S1 & S2 heard, RRR. No JVD, murmurs, rubs, gallops or clicks. No pedal edema. Gastrointestinal system: Abdomen is nondistended, soft and nontender. No organomegaly or masses felt. Normal bowel sounds heard. Extremities: Symmetric 5 x 5 power. Skin: No rashes, lesions or ulcers Psychiatry: +memory loss  Data Reviewed: I have personally reviewed following labs and imaging studies  CBC: Recent Labs  Lab 01/21/18 1519 01/22/18 0620  WBC 8.3 6.3  HGB 14.7 13.8  HCT  42.9 41.1  MCV 93.7 94.7  PLT 215 199   Basic Metabolic Panel: Recent Labs  Lab 01/21/18 1519 01/21/18 1520 01/22/18 0620  NA 139  --  140  K 3.2*  --  3.4*  CL 102  --  105  CO2 27  --  27  GLUCOSE 123*  --  87  BUN 15  --  16  CREATININE 0.55  --  0.53  CALCIUM 9.2  --  8.8*  MG  --  2.0  --    GFR: Estimated Creatinine Clearance: 33.3 mL/min (by C-G formula based on SCr of 0.53 mg/dL). Liver Function Tests: No results for input(s): AST, ALT, ALKPHOS, BILITOT, PROT, ALBUMIN in the last 168 hours. No results for input(s): LIPASE, AMYLASE in the last 168 hours. No results for input(s): AMMONIA in the last 168 hours. Coagulation Profile: No results for input(s): INR, PROTIME in the last 168 hours. Cardiac Enzymes: No results for input(s): CKTOTAL, CKMB, CKMBINDEX, TROPONINI in the last 168 hours. BNP (last 3 results) No results for input(s): PROBNP in the last 8760 hours. HbA1C: No results for input(s): HGBA1C in the last 72 hours. CBG: No results for input(s): GLUCAP in the last 168 hours. Lipid Profile: No results for input(s): CHOL, HDL, LDLCALC, TRIG, CHOLHDL, LDLDIRECT in the last 72 hours. Thyroid Function Tests: Recent Labs    01/22/18 0620  TSH 3.256   Anemia Panel: No results for input(s): VITAMINB12, FOLATE, FERRITIN, TIBC, IRON, RETICCTPCT in the last 72 hours. Sepsis Labs: No results for input(s): PROCALCITON, LATICACIDVEN in the last 168 hours.  Recent Results (from the past 240 hour(s))  MRSA PCR Screening     Status: None   Collection Time: 01/22/18  6:27 AM  Result Value Ref Range Status   MRSA by PCR NEGATIVE NEGATIVE Final    Comment:        The GeneXpert MRSA Assay (FDA approved for NASAL specimens only), is one component of a comprehensive MRSA colonization surveillance program. It is not intended to diagnose MRSA infection nor to guide or monitor treatment for MRSA infections. Performed at [facility], [address] [place], [place] [address]        Radiology Studies: Dg Chest 2 View  Result Date: 01/21/2018 CLINICAL DATA:  Shortness of breath. EXAM: CHEST - 2 VIEW COMPARISON:  Radiograph of February 20, 2010. FINDINGS: Stable cardiomegaly. Atherosclerosis of thoracic aorta is noted. Stable calcified left hilar lymph node. No pneumothorax or pleural effusion is noted. No acute pulmonary disease is noted. Bony thorax is unremarkable. IMPRESSION: No active cardiopulmonary disease. Aortic Atherosclerosis (ICD10-I70.0). Electronically Signed   By: Lupita RaiderJames  Green Jr, M.D.   On: 01/21/2018 17:06   Dg Lumbar Spine Complete  Result Date: 01/21/2018 CLINICAL DATA:  Acute low back pain after fall 2 days ago. EXAM: LUMBAR SPINE -  COMPLETE 4+ VIEW COMPARISON:  CT scan of October 11, 2006. FINDINGS: Diffuse osteopenia is noted. No spondylolisthesis is noted. Moderate superior endplate depression of L3 vertebral body is noted most consistent with old fracture. Moderate degenerative disc disease is noted at L4-5 and L5-S1. Degenerative changes seen involving posterior facet joints of L5-S1. IMPRESSION: Probable old L3 vertebral body fracture is noted. Multilevel degenerative disc disease. No acute abnormality seen in the lumbar spine. Electronically Signed   By: Lupita Raider, M.D.   On: 01/21/2018 14:42      Scheduled Meds: . amLODipine  10 mg Oral Daily  . benazepril  20 mg Oral Daily  . enoxaparin (LOVENOX) injection  40 mg Subcutaneous Q24H  . potassium chloride  40 mEq Oral Once   Continuous Infusions: . cefTRIAXone (ROCEPHIN)  IV    . diltiazem (CARDIZEM) infusion Stopped (01/22/18 0251)     LOS: 1 day    Time spent: 30 minutes   Noralee Stain, DO [facility] [email or web address] Password [email or web address] 01/22/2018, 8:40 AM

## 2018-01-22 NOTE — Evaluation (Signed)
Physical Therapy Evaluation Patient Details Name: Peggy KaufmanMargaret W Webb MRN: [id number] DOB: 08/16/1914 Today's Date: 01/22/2018   History of Present Illness  [age] yo female admitted with Afib with RVR, recent fall, L3 vertebral fx.   Clinical Impression  On eval, pt required Mod assist for mobility. Pt was able to stand with significant assistance and use of a RW however, she was unable to take any pivotal or ambulatory steps. Pt c/o low back/side pain with standing. She would request to sit back down once pain hit her. Family (son n law) present during session. He stated family may be interested in placement. Will recommend SNF at this time. Will follow and progress activity as tolerated.     Follow Up Recommendations SNF    Equipment Recommendations  None recommended by PT    Recommendations for Other Services       Precautions / Restrictions Precautions Precautions: Fall Restrictions Weight Bearing Restrictions: No      Mobility  Bed Mobility Overal bed mobility: Needs Assistance Bed Mobility: Supine to Sit;Sit to Supine     Supine to sit: HOB elevated;Mod assist Sit to supine: HOB elevated;Mod assist   General bed mobility comments: Assist for trunk and bil LEs. Utilized bedpad for scooting, positioning. Cues for safety, instructions for task.   Transfers Overall transfer level: Needs assistance Equipment used: Rolling walker (2 wheeled) Transfers: Sit to/from Stand Sit to Stand: Mod assist;From elevated surface         General transfer comment: Attempted x 4. Pt was only able to fully stand x 1. Heavy posterior bias. Unable to safely attempt and pivotal or ambuatory steps at this time.   Ambulation/Gait             General Gait Details: NT on today.   Stairs            Wheelchair Mobility    Modified Rankin (Stroke Patients Only)       Balance Overall balance assessment: Needs assistance   Sitting balance-Leahy Scale: Fair   Postural control:  Posterior lean Standing balance support: Bilateral upper extremity supported Standing balance-Leahy Scale: Poor                               Pertinent Vitals/Pain Pain Assessment: Faces Faces Pain Scale: Hurts even more Pain Location: low back with activity Pain Descriptors / Indicators: Grimacing;Guarding;Sharp Pain Intervention(s): Limited activity within patient's tolerance;Repositioned    Home Living Family/patient expects to be discharged to:: Unsure Living Arrangements: Children Available Help at Discharge: Family;Available 24 hours/day Type of Home: House Home Access: Ramped entrance     Home Layout: One level Home Equipment: Walker - 2 wheels      Prior Function Level of Independence: Needs assistance   Gait / Transfers Assistance Needed: ambulatory with a RW  ADL's / Homemaking Assistance Needed: family/aide assisted with bathng, dressing        Hand Dominance        Extremity/Trunk Assessment   Upper Extremity Assessment Upper Extremity Assessment: Generalized weakness    Lower Extremity Assessment Lower Extremity Assessment: Generalized weakness    Cervical / Trunk Assessment Cervical / Trunk Assessment: Kyphotic  Communication   Communication: HOH  Cognition Arousal/Alertness: Awake/alert Behavior During Therapy: WFL for tasks assessed/performed Overall Cognitive Status: History of cognitive impairments - at baseline  General Comments      Exercises     Assessment/Plan    PT Assessment Patient needs continued PT services  PT Problem List Decreased strength;Decreased mobility;Decreased activity tolerance;Decreased balance;Pain;Decreased cognition;Decreased safety awareness       PT Treatment Interventions DME instruction;Gait training;Therapeutic activities;Therapeutic exercise;Patient/family education;Functional mobility training;Balance training    PT Goals (Current  goals can be found in the Care Plan section)  Acute Rehab PT Goals Patient Stated Goal: family wants rehab PT Goal Formulation: With family Time For Goal Achievement: 02/05/18 Potential to Achieve Goals: Good    Frequency Min 3X/week   Barriers to discharge        Co-evaluation               AM-PAC PT "6 Clicks" Daily Activity  Outcome Measure Difficulty turning over in bed (including adjusting bedclothes, sheets and blankets)?: Unable Difficulty moving from lying on back to sitting on the side of the bed? : Unable Difficulty sitting down on and standing up from a chair with arms (e.g., wheelchair, bedside commode, etc,.)?: Unable Help needed moving to and from a bed to chair (including a wheelchair)?: A Lot Help needed walking in hospital room?: A Lot Help needed climbing 3-5 steps with a railing? : Total 6 Click Score: 8    End of Session   Activity Tolerance: Patient limited by pain Patient left: in bed;with bed alarm set;with family/visitor present   PT Visit Diagnosis: History of falling (Z91.81);Difficulty in walking, not elsewhere classified (R26.2);Other abnormalities of gait and mobility (R26.89);Muscle weakness (generalized) (M62.81)    Time: 1610-9604 PT Time Calculation (min) (ACUTE ONLY): 34 min   Charges:   PT Evaluation $PT Eval Moderate Complexity: 1 Mod PT Treatments $Therapeutic Activity: 8-22 mins   PT G Codes:          Rebeca Alert, MPT Pager: [phone]

## 2018-01-22 NOTE — Progress Notes (Signed)
Notified NP pt pulling at tubing, trying to crawl out of bed. New orders noted and received.

## 2018-01-23 LAB — BASIC METABOLIC PANEL
Anion gap: 8 (ref 5–15)
BUN: 14 mg/dL (ref 6–20)
CALCIUM: 8.9 mg/dL (ref 8.9–10.3)
CO2: 28 mmol/L (ref 22–32)
CREATININE: 0.54 mg/dL (ref 0.44–1.00)
Chloride: 102 mmol/L (ref 101–111)
GFR calc Af Amer: >60 mL/min (ref >60)
GLUCOSE: 127 mg/dL — AB (ref 65–99)
Potassium: 3.6 mmol/L (ref 3.5–5.1)
Sodium: 138 mmol/L (ref 135–145)

## 2018-01-23 LAB — MAGNESIUM: Magnesium: 1.9 mg/dL (ref 1.7–2.4)

## 2018-01-23 MED ORDER — METOPROLOL TARTRATE 5 MG/5ML IV SOLN
5.0000 mg | Freq: Once | INTRAVENOUS | Status: AC
  Administered 2018-01-23: 5 mg via INTRAVENOUS

## 2018-01-23 MED ORDER — SODIUM CHLORIDE 0.9 % IV BOLUS
500.0000 mL | Freq: Once | INTRAVENOUS | Status: AC
  Administered 2018-01-23: 500 mL via INTRAVENOUS

## 2018-01-23 MED ORDER — POLYETHYLENE GLYCOL 3350 17 G PO PACK
17.0000 g | PACK | Freq: Every day | ORAL | Status: DC | PRN
Start: 2018-01-23 — End: 2018-01-25
  Administered 2018-01-23: 17 g via ORAL
  Filled 2018-01-23: qty 1

## 2018-01-23 MED ORDER — LABETALOL HCL 100 MG PO TABS
100.0000 mg | ORAL_TABLET | Freq: Two times a day (BID) | ORAL | Status: DC
  Administered 2018-01-23 (×2): 100 mg via ORAL
  Filled 2018-01-23 (×2): qty 1

## 2018-01-23 MED ORDER — ASPIRIN 81 MG PO CHEW
81.0000 mg | CHEWABLE_TABLET | Freq: Every day | ORAL | Status: DC
  Administered 2018-01-23 – 2018-01-25 (×3): 81 mg via ORAL
  Filled 2018-01-23 (×3): qty 1

## 2018-01-23 MED ORDER — FOSFOMYCIN TROMETHAMINE 3 G PO PACK
3.0000 g | PACK | Freq: Once | ORAL | Status: AC
  Administered 2018-01-23: 3 g via ORAL
  Filled 2018-01-23: qty 3

## 2018-01-23 MED ORDER — METOPROLOL TARTRATE 5 MG/5ML IV SOLN
INTRAVENOUS | Status: AC
  Filled 2018-01-23: qty 5

## 2018-01-23 NOTE — Progress Notes (Signed)
Occupational Therapy Evaluation Patient Details Name: Peggy KaufmanMargaret W Webb MRN: [id number] DOB: 09/12/1914 Today's Date: 01/23/2018    History of Present Illness [age] yo female admitted with Afib with RVR, recent fall, L3 vertebral fx.    Clinical Impression   Pt was admitted for the above. At baseline, she lives [place] daughter and [place]. She often performs ADLs without assistance but they will help her as needed.  Pt has back pain which limits her ADLs.  She was able to sit EOB for several minutes and stand briefly. Will follow in acute care for adls and mobility to support caregiver in assisting with ADLs    Follow Up Recommendations  SNF    Equipment Recommendations  (to be further assessed:  cannot use 3:1 yet)    Recommendations for Other Services       Precautions / Restrictions Precautions Precautions: Fall Restrictions Weight Bearing Restrictions: No      Mobility Bed Mobility     Rolling: Max assist Sidelying to sit: Mod assist;Max assist   Sit to supine: Max assist;HOB elevated   General bed mobility comments: pt had difficulty keeping LLE bent to help with rolling.  Used rail to help herself into sit from sidelying. Raised HOB to get her back to bed with +1 assist  Transfers   Equipment used: Rolling walker (2 wheeled) Transfers: Sit to/from Stand Sit to Stand: Mod assist         General transfer comment: assist to rise and stabilize. Stood approximately 10 seconds    Balance     Sitting balance-Leahy Scale: Fair   Postural control: Posterior lean(as she fatiqued) Standing balance support: Bilateral upper extremity supported Standing balance-Leahy Scale: Poor                             ADL either performed or assessed with clinical judgement   ADL Overall ADL's : Needs assistance/impaired Eating/Feeding: Maximal assistance;Sitting   Grooming: Wash/dry hands;Wash/dry face;Moderate assistance;Sitting   Upper Body Bathing: Maximal  assistance;Sitting   Lower Body Bathing: Total assistance;Sit to/from stand   Upper Body Dressing : Maximal assistance;Sitting   Lower Body Dressing: Total assistance;Sit to/from stand                 General ADL Comments: pt sat at EOB for several minutes and performed grooming. Pt states that back hurts when she sits too long. Stood with RW x 10 seconds; able to move chux pad under her.     Vision Baseline Vision/History: Wears glasses       Perception     Praxis      Pertinent Vitals/Pain Faces Pain Scale: Hurts little more Pain Location: low back with activity Pain Descriptors / Indicators: Sore Pain Intervention(s): Limited activity within patient's tolerance;Monitored during session;Repositioned     Hand Dominance     Extremity/Trunk Assessment Upper Extremity Assessment Upper Extremity Assessment: Generalized weakness           Communication Communication Communication: HOH   Cognition Arousal/Alertness: Awake/alert Behavior During Therapy: WFL for tasks assessed/performed Overall Cognitive Status: History of cognitive impairments - at baseline                                 General Comments: HOH despite hearing aides; multimodal cues   General Comments       Exercises     Shoulder Instructions  Home Living Family/patient expects to be discharged to:: Skilled nursing facility Living Arrangements: Children                                      Prior Functioning/Environment Level of Independence: Needs assistance        Comments: family would aide with adls as needed.  Did not always need help        OT Problem List: Decreased strength;Decreased activity tolerance;Impaired balance (sitting and/or standing);Pain;Decreased knowledge of precautions      OT Treatment/Interventions: Self-care/ADL training;DME and/or AE instruction;Patient/family education;Balance training;Therapeutic activities    OT  Goals(Current goals can be found in the care plan section) Acute Rehab OT Goals Patient Stated Goal: family wants rehab OT Goal Formulation: With patient/family Time For Goal Achievement: 02/06/18 Potential to Achieve Goals: Good ADL Goals Pt Will Transfer to Toilet: with mod assist;with +2 assist;bedside commode;stand pivot transfer Additional ADL Goal #1: Pt will sit eob with min guard assist and perform light adl activity Additional ADL Goal #2: pt will go from sit to stand for adls with min A and maintain x 1 minute at this level  OT Frequency: Min 2X/week   Barriers to D/C:            Co-evaluation              AM-PAC PT "6 Clicks" Daily Activity     Outcome Measure Help from another person eating meals?: A Lot Help from another person taking care of personal grooming?: A Lot Help from another person toileting, which includes using toliet, bedpan, or urinal?: A Lot Help from another person bathing (including washing, rinsing, drying)?: A Lot Help from another person to put on and taking off regular upper body clothing?: A Lot Help from another person to put on and taking off regular lower body clothing?: Total 6 Click Score: 11   End of Session    Activity Tolerance: Patient limited by pain Patient left: in bed;with call bell/phone within reach  OT Visit Diagnosis: Muscle weakness (generalized) (M62.81);Unsteadiness on feet (R26.81)                Time: 1349-1410 OT Time Calculation (min): 21 min Charges:  OT General Charges $OT Visit: 1 Visit OT Evaluation $OT Eval Low Complexity: 1 Low G-Codes:     [place], OTR/L [phone] 01/23/2018  Alisa Stjames 01/23/2018, 2:49 PM

## 2018-01-23 NOTE — Progress Notes (Addendum)
Pt HR sustaining in the 130s-160s. NP on call notified. Awaiting orders. Will continue to monitor Pt.   --Orders for 5mg  lopressor IV and a 500ml bolus given. Pt HR is now 80s-70s. Will continue to monitor.

## 2018-01-23 NOTE — [place] FL2 (Signed)
[place] MEDICAID FL2 LEVEL OF CARE SCREENING TOOL     IDENTIFICATION  Patient Name: ZAARA SPROWL Birthdate: 12-23-1913 Sex: female Admission Date (Current Location): 01/21/2018  [facility] and [place] Number:  Producer, television/film/video and Address:  [facility]  [address] [place], [place] [address]      Provider Number: [id number]  Attending Physician Name and Address:  Noralee Stain, DO  Relative Name and Phone Number:       Current Level of Care: Hospital Recommended Level of Care: Skilled Nursing Facility Prior Approval Number:    Date Approved/Denied:   PASRR Number: [id number]  Discharge Plan: SNF    Current Diagnoses: Patient Active Problem List   Diagnosis Date Noted  . Atrial fibrillation with RVR (HCC) 01/21/2018    Orientation RESPIRATION BLADDER Height & Weight     Self  Normal Incontinent Weight: 132 lb 15 oz (60.3 kg) Height:  5\' 6"  (167.6 cm)  BEHAVIORAL SYMPTOMS/MOOD NEUROLOGICAL BOWEL NUTRITION STATUS        Diet(Heart)  AMBULATORY STATUS COMMUNICATION OF NEEDS Skin   Extensive Assist Verbally Normal                       Personal Care Assistance Level of Assistance  Bathing, Feeding, Dressing Bathing Assistance: Maximum assistance Feeding assistance: Independent Dressing Assistance: Maximum assistance     Functional Limitations Info  Sight, Hearing, Speech Sight Info: Adequate Hearing Info: Impaired Speech Info: Adequate    SPECIAL CARE FACTORS FREQUENCY  PT (By licensed PT), OT (By licensed OT)     PT Frequency: 5x/week OT Frequency: 5x/week            Contractures      Additional Factors Info  Code Status, Allergies Code Status Info: DNR Allergies Info: Shrimp Shellfish Allergy           Current Medications (01/23/2018):  This is the current hospital active medication list Current Facility-Administered Medications  Medication Dose Route Frequency Provider Last Rate Last Dose  . acetaminophen  (TYLENOL) tablet 650 mg  650 mg Oral Q6H PRN Noralee Stain, DO       Or  . acetaminophen (TYLENOL) suppository 650 mg  650 mg Rectal Q6H PRN Noralee Stain, DO      . amLODipine (NORVASC) tablet 10 mg  10 mg Oral Daily Noralee Stain, DO   10 mg at 01/23/18 0957  . aspirin chewable tablet 81 mg  81 mg Oral Daily Noralee Stain, DO   81 mg at 01/23/18 0956  . benazepril (LOTENSIN) tablet 20 mg  20 mg Oral Daily Noralee Stain, DO   20 mg at 01/23/18 0956  . enoxaparin (LOVENOX) injection 40 mg  40 mg Subcutaneous Q24H Noralee Stain, DO   40 mg at 01/22/18 2109  . haloperidol lactate (HALDOL) injection 5 mg  5 mg Intravenous Q6H PRN Noralee Stain, DO   5 mg at 01/23/18 0209  . labetalol (NORMODYNE) tablet 100 mg  100 mg Oral BID Noralee Stain, DO   100 mg at 01/23/18 0859  . ondansetron (ZOFRAN) tablet 4 mg  4 mg Oral Q6H PRN Noralee Stain, DO       Or  . ondansetron [facility]) injection 4 mg  4 mg Intravenous Q6H PRN Noralee Stain, DO      . polyethylene glycol (MIRALAX / GLYCOLAX) packet 17 g  17 g Oral Daily PRN Noralee Stain, DO   17 g at 01/23/18 0956     Discharge  Medications: Please see discharge summary for a list of discharge medications.  Relevant Imaging Results:  Relevant Lab Results:   Additional Information SSN  [id number]  Antionette PolesKimberly L Jerriann Schrom, LCSW

## 2018-01-23 NOTE — Clinical Social Work Note (Signed)
Clinical Social Work Assessment  Patient Details  Name: Peggy KaufmanMargaret W Bissette MRN: [id number] Date of Birth: 04/13/1914  Date of referral:  01/23/18               Reason for consult:  Facility Placement                Permission sought to share information with:    Permission granted to share information::     Name::        Agency::     Relationship::     Contact Information:     Housing/Transportation Living arrangements for the past 2 months:  Single Family Home Source of Information:  Adult Children(Daughter - Peggy BallerDebra Webb  Son-in-law - Beckey Downingwight Webb) Patient Interpreter Needed:  None Criminal Activity/Legal Involvement Pertinent to Current Situation/Hospitalization:  No - Comment as needed Significant Relationships:  Adult Children Lives with:  Adult Children Do you feel safe going back to the place where you live?  (PT recommending SNF) Need for family participation in patient care:  Yes (Comment)  Care giving concerns:  Patient from home with daughter and son-in-law. Patient's son in law reported that patient has been staying with them for the past 13 years. Patient's son-in-law reported that patient uses a walker and requires some assistance with ADLs at times in the home. Patient's son reported that patient was able to go to the restroom by herself. Patient admitted after having a fall at home. PT recommending SNF.   Social Worker assessment / plan:  CSW spoke with patient's son in law regarding patient's discharge planning, patient asleep. Patient's son-in-law reported that they are interested in [facility] for ST rehab. CSW explained SNF placement process and medicare coverage, patient's son-in-law verbalized understanding. CSW inquired if patient had a HCPOA, patient's son-in-law reported that patient's daughter is patient's HCPOA and POA. CSW agreed to follow up with patient's daughter to confirm plan for patient to dc to SNF for ST rehab.  CSW spoke with patient's  daughter Peggy Webb(Peggy Webb [phone]) who confirmed plan for patient to dc to SNF for ST rehab. Patient's daughter reported that they prefer a private room at [facility]. CSW agreed to follow up with [facility] to see if they have a room available for patient.  CSW contacted [facility] to inquire about availability for patient. Staff member Everardo PacificKenisha reported that they will have a semiprivate room available for patient tomorrow.   CSW updated patient's son-in-law.  CSW will continue to follow and assist with discharge planning.  Employment status:  Retired Health and safety inspectornsurance information:  Medicare PT Recommendations:  Skilled Nursing Facility Information / Referral to community resources:  Skilled Nursing Facility  Patient/Family's Response to care:  Patient's daughter and patient's son in Chief Operating Officerlaw appreciative of CSW assistance with discharge planning.   Patient/Family's Understanding of and Emotional Response to Diagnosis, Current Treatment, and Prognosis:  Patient asleep, patient's son in law involved in patient's care and verbalized understanding of patient's diagnosis and current treatment. Patient's daughter and son-in-law verbalized plan for patient to dc to SNF for ST rehab. Patient's son-in-law hopeful that patient will return to baseline of functioning and return home after rehab.   Emotional Assessment Appearance:  Appears stated age Attitude/Demeanor/Rapport:  Unable to Assess Affect (typically observed):  Unable to Assess Orientation:  Oriented to Self Alcohol / Substance use:  Not Applicable Psych involvement (Current and /or in the community):  No (Comment)  Discharge Needs  Concerns to be addressed:  Care Coordination Readmission within the last 30 days:  No Current discharge risk:  Physical Impairment Barriers to Discharge:  Continued Medical Work up   [employer], LCSW 01/23/2018, 2:22 PM

## 2018-01-23 NOTE — Progress Notes (Addendum)
PROGRESS NOTE    Peggy Webb Aries  ZOX:[id number] DOB: 01/10/1914 DOA: 01/21/2018 PCP: Angela Coxasanayaka, Gayani Y, MD     Brief Narrative:  Peggy Webb Vowels is a 84 y.o. female with medical history significant of HTN, hx colon cancer s/p partial colectomy in remission, memory loss who presents with low back pain. She had fallen/slid down at home a few days ago at the edge of the bed. She had an outpatient hip xray which was negative. She was initially doing well, walking with a walker but then continued to complain of low back pain and family felt she was acting more irritable/confused than normal so she was brought in to the hospital. She has memory loss although no formal diagnosis of dementia. She lives at home with daughter and son-in-law. In the ED, EKG revealed new onset A Fib. UA positive for nitrite, many bacteria, 0-5 WBC. Due to A Fib RVR rate 100-140s, she was referred for admission. She is started on IV rocephin for UTI while awaiting urine culture result and IV cardizem for rate control.   Assessment & Plan:   Active Problems:   Atrial fibrillation with RVR (HCC)   A Fib RVR, new onset A Fib -CHADSVASC score 4. Discussed with family regarding anticoagulation vs aspirin in this 84 year old patient with fall at home. Not sure that she is a great candidate for full anticoagulation, although her risk of stroke remains high. Family okay with starting baby aspirin for stroke prevention  -TSH normal -Echo showed EF 50-55%, grade 2 dd  -Off cardizem gtt, remained in sinus bradycardia yesterday then went into A fib RVR overnight. Resume home labetaolol   UTI, POA -Urine culture with contaminant -Was treated with IV rocephin. Will give 1 dose fosfomycin.   Delirium -In setting of infection, a fib, memory loss at baseline -Improved after haldol   Recent fall at home -Lumbar xray negative for acute fracture -PT OT recommending SNF   HTN -Amlodipine, lotensin, labetalol    DVT  prophylaxis: Lovenox Code Status: DNR Family Communication: Son in Social workerlaw at bedside, also called daughter for update  Disposition Plan: SNF once HR consistently controlled on oral regimen    Consultants:   None  Procedures:   None   Antimicrobials:  Anti-infectives (From admission, onward)   Start     Dose/Rate Route Frequency Ordered Stop   01/22/18 1800  cefTRIAXone (ROCEPHIN) 1 g in sodium chloride 0.9 % 100 mL IVPB  Status:  Discontinued     1 g 200 mL/hr over 30 Minutes Intravenous Every 24 hours 01/21/18 1928 01/23/18 0940   01/21/18 1800  cefTRIAXone (ROCEPHIN) 1 g in sodium chloride 0.9 % 100 mL IVPB     1 g 200 mL/hr over 30 Minutes Intravenous  Once 01/21/18 1750 01/21/18 1855       Subjective: Constipated, no BM for days. No other acute complaints, denies chest pain, SOB, abdominal pain.   Objective: Vitals:   01/23/18 0400 01/23/18 0859 01/23/18 0927 01/23/18 1046  BP: 127/63  (!) 114/91   Pulse: 99 (!) 142 (!) 57 75  Resp: 16  18   Temp: 97.8 F (36.6 C)  98.3 F (36.8 C)   TempSrc: Axillary  Oral   SpO2: 97%  97%   Weight:      Height:        Intake/Output Summary (Last 24 hours) at 01/23/2018 1240 Last data filed at 01/23/2018 0900 Gross per 24 hour  Intake 660 ml  Output  750 ml  Net -90 ml   Filed Weights   01/21/18 1929  Weight: 60.3 kg (132 lb 15 oz)   Examination: General exam: Appears calm and comfortable  Respiratory system: Clear to auscultation. Respiratory effort normal. Cardiovascular system: S1 & S2 heard, irreg rhythm rate 120s. No JVD, murmurs, rubs, gallops or clicks. No pedal edema. Gastrointestinal system: Abdomen is nondistended, soft and nontender. No organomegaly or masses felt. Normal bowel sounds heard. Central nervous system: Alert  Extremities: Symmetric 5 x 5 power. Skin: No rashes, lesions or ulcers Psychiatry: +Memory loss   Data Reviewed: I have personally reviewed following labs and imaging studies  CBC: Recent  Labs  Lab 01/21/18 1519 01/22/18 0620  WBC 8.3 6.3  HGB 14.7 13.8  HCT 42.9 41.1  MCV 93.7 94.7  PLT 215 199   Basic Metabolic Panel: Recent Labs  Lab 01/21/18 1519 01/21/18 1520 01/22/18 0620 01/23/18 0530  NA 139  --  140 138  K 3.2*  --  3.4* 3.6  CL 102  --  105 102  CO2 27  --  27 28  GLUCOSE 123*  --  87 127*  BUN 15  --  16 14  CREATININE 0.55  --  0.53 0.54  CALCIUM 9.2  --  8.8* 8.9  MG  --  2.0  --  1.9   GFR: Estimated Creatinine Clearance: 33.3 mL/min (by C-G formula based on SCr of 0.54 mg/dL). Liver Function Tests: No results for input(s): AST, ALT, ALKPHOS, BILITOT, PROT, ALBUMIN in the last 168 hours. No results for input(s): LIPASE, AMYLASE in the last 168 hours. No results for input(s): AMMONIA in the last 168 hours. Coagulation Profile: No results for input(s): INR, PROTIME in the last 168 hours. Cardiac Enzymes: No results for input(s): CKTOTAL, CKMB, CKMBINDEX, TROPONINI in the last 168 hours. BNP (last 3 results) No results for input(s): PROBNP in the last 8760 hours. HbA1C: No results for input(s): HGBA1C in the last 72 hours. CBG: No results for input(s): GLUCAP in the last 168 hours. Lipid Profile: No results for input(s): CHOL, HDL, LDLCALC, TRIG, CHOLHDL, LDLDIRECT in the last 72 hours. Thyroid Function Tests: Recent Labs    01/22/18 0620  TSH 3.256   Anemia Panel: No results for input(s): VITAMINB12, FOLATE, FERRITIN, TIBC, IRON, RETICCTPCT in the last 72 hours. Sepsis Labs: No results for input(s): PROCALCITON, LATICACIDVEN in the last 168 hours.  Recent Results (from the past 240 hour(s))  Urine culture     Status: Abnormal   Collection Time: 01/21/18  3:19 PM  Result Value Ref Range Status   Specimen Description   Final    URINE, RANDOM Performed at [facility], [address] [place], [place] [address]    Special Requests   Final    NONE Performed at [facility], [address], [place], [place] [address]    Culture MULTIPLE SPECIES PRESENT, SUGGEST RECOLLECTION (A)  Final   Report Status 01/22/2018 FINAL  Final  MRSA PCR Screening     Status: None   Collection Time: 01/22/18  6:27 AM  Result Value Ref Range Status   MRSA by PCR NEGATIVE NEGATIVE Final    Comment:        The GeneXpert MRSA Assay (FDA approved for NASAL specimens only), is one component of a comprehensive MRSA colonization surveillance program. It is not intended to diagnose MRSA infection nor to guide or monitor treatment for MRSA infections. Performed at [facility], [address], [place], [place] [address]        Radiology Studies: Dg Chest 2 View  Result Date: 01/21/2018 CLINICAL DATA:  Shortness of breath. EXAM: CHEST - 2 VIEW COMPARISON:  Radiograph of February 20, 2010. FINDINGS: Stable cardiomegaly. Atherosclerosis of thoracic aorta is noted. Stable calcified left hilar lymph node. No pneumothorax or pleural effusion is noted. No acute pulmonary disease is noted. Bony thorax is unremarkable. IMPRESSION: No active cardiopulmonary disease. Aortic Atherosclerosis (ICD10-I70.0). Electronically Signed   By: Lupita Raider, M.D.   On: 01/21/2018 17:06   Dg Lumbar Spine Complete  Result Date: 01/21/2018 CLINICAL DATA:  Acute low back pain after fall 2 days ago. EXAM: LUMBAR SPINE - COMPLETE 4+ VIEW COMPARISON:  CT scan of October 11, 2006. FINDINGS: Diffuse osteopenia is noted. No spondylolisthesis is noted. Moderate superior endplate depression of L3 vertebral body is noted most consistent with old fracture. Moderate degenerative disc disease is noted at L4-5 and L5-S1. Degenerative changes seen involving posterior facet joints of L5-S1. IMPRESSION: Probable old L3 vertebral body fracture is noted. Multilevel degenerative disc disease. No acute abnormality seen in the lumbar spine. Electronically Signed   By: Lupita Raider, M.D.   On: 01/21/2018 14:42       Scheduled Meds: . amLODipine  10 mg Oral Daily  . aspirin  81 mg Oral Daily  . benazepril  20 mg Oral Daily  . enoxaparin (LOVENOX) injection  40 mg Subcutaneous Q24H  . labetalol  100 mg Oral BID  . metoprolol tartrate       Continuous Infusions:    LOS: 2 days    Time spent: 30 minutes   Noralee Stain, DO [facility] [email or web address] Password [email or web address] 01/23/2018, 12:40 PM

## 2018-01-24 DIAGNOSIS — I10 Essential (primary) hypertension: Secondary | ICD-10-CM

## 2018-01-24 DIAGNOSIS — R531 Weakness: Secondary | ICD-10-CM

## 2018-01-24 DIAGNOSIS — I4891 Unspecified atrial fibrillation: Principal | ICD-10-CM

## 2018-01-24 LAB — BASIC METABOLIC PANEL WITH GFR
Anion gap: 14 (ref 5–15)
BUN: 19 mg/dL (ref 6–20)
CO2: 22 mmol/L (ref 22–32)
Calcium: 9.2 mg/dL (ref 8.9–10.3)
Chloride: 100 mmol/L — ABNORMAL LOW (ref 101–111)
Creatinine, Ser: 0.69 mg/dL (ref 0.44–1.00)
GFR calc Af Amer: >60 mL/min
GFR calc non Af Amer: >60 mL/min
Glucose, Bld: 106 mg/dL — ABNORMAL HIGH (ref 65–99)
Potassium: 3.6 mmol/L (ref 3.5–5.1)
Sodium: 136 mmol/L (ref 135–145)

## 2018-01-24 MED ORDER — DILTIAZEM HCL 90 MG PO TABS
90.0000 mg | ORAL_TABLET | Freq: Two times a day (BID) | ORAL | Status: DC
  Administered 2018-01-24: 90 mg via ORAL
  Filled 2018-01-24: qty 1

## 2018-01-24 MED ORDER — DILTIAZEM HCL 90 MG PO TABS
90.0000 mg | ORAL_TABLET | Freq: Three times a day (TID) | ORAL | Status: DC
  Administered 2018-01-24 – 2018-01-25 (×3): 90 mg via ORAL
  Filled 2018-01-24 (×3): qty 1

## 2018-01-24 MED ORDER — METOPROLOL TARTRATE 5 MG/5ML IV SOLN
10.0000 mg | Freq: Once | INTRAVENOUS | Status: AC
  Administered 2018-01-24: 10 mg via INTRAVENOUS
  Filled 2018-01-24: qty 10

## 2018-01-24 NOTE — Progress Notes (Signed)
HR was sustaining 140's. Patient asymptomatic. MD made aware and PO Cardizem increased from BID to QID. Dose given at 1755. At 1900 HR was sustaining in the 150's with patient at rest. BP 138/76. On call MD made aware of increasing HR even after PO Cardizem dose. Orders received to give 10mg  IV Metoprolol once. Will give dose and continue to monitor HR.

## 2018-01-24 NOTE — Progress Notes (Signed)
Physical Therapy Treatment Patient Details Name: Peggy KaufmanMargaret W Webb MRN: [id number] DOB: 03/31/1914 Today's Date: 01/24/2018    History of Present Illness [age] yo female admitted with Afib with RVR, recent fall, L3 vertebral fx.     PT Comments    Gradual progress with mobility today, pt was able to transfer from bed to recliner with RW and +2 min assist. Activity tolerance limited by back pain. SNF recommended.   Follow Up Recommendations  SNF     Equipment Recommendations  None recommended by PT    Recommendations for Other Services       Precautions / Restrictions Precautions Precautions: Fall Restrictions Weight Bearing Restrictions: No    Mobility  Bed Mobility   Bed Mobility: Supine to Sit     Supine to sit: Mod assist     General bed mobility comments: pt had back pain with attempted rolling to R so performed supine to sit with mod A to raise trunk  Transfers   Equipment used: Rolling walker (2 wheeled) Transfers: Sit to/from [employer];Stand Pivot Transfers Sit to Stand: Mod assist Stand pivot transfers: +2 safety/equipment;Min assist       General transfer comment: sit to stand x 2 trials, assist to rise/steady 2* posterior lean, min A to pivot with manual and verbal cues for technique  Ambulation/Gait                 Stairs            Wheelchair Mobility    Modified Rankin (Stroke Patients Only)       Balance     Sitting balance-Leahy Scale: Fair   Postural control: Posterior lean(as she fatiqued) Standing balance support: Bilateral upper extremity supported Standing balance-Leahy Scale: Poor                              Cognition Arousal/Alertness: Awake/alert Behavior During Therapy: WFL for tasks assessed/performed Overall Cognitive Status: History of cognitive impairments - at baseline                                 General Comments: HOH despite hearing aides; multimodal cues      Exercises       General Comments        Pertinent Vitals/Pain Faces Pain Scale: Hurts little more Pain Location: low back with activity Pain Descriptors / Indicators: Grimacing Pain Intervention(s): Limited activity within patient's tolerance;Monitored during session;Repositioned    Home Living                      Prior Function            PT Goals (current goals can now be found in the care plan section) Acute Rehab PT Goals Patient Stated Goal: family wants rehab PT Goal Formulation: With family Time For Goal Achievement: 02/05/18 Potential to Achieve Goals: Good Progress towards PT goals: Progressing toward goals    Frequency    Min 2X/week      PT Plan Current plan remains appropriate    Co-evaluation              AM-PAC PT "6 Clicks" Daily Activity  Outcome Measure  Difficulty turning over in bed (including adjusting bedclothes, sheets and blankets)?: Unable Difficulty moving from lying on back to sitting on the side of the bed? : Unable Difficulty sitting down on  and standing up from a chair with arms (e.g., wheelchair, bedside commode, etc,.)?: Unable Help needed moving to and from a bed to chair (including a wheelchair)?: A Lot Help needed walking in hospital room?: Total Help needed climbing 3-5 steps with a railing? : Total 6 Click Score: 7    End of Session Equipment Utilized During Treatment: Gait belt Activity Tolerance: Patient limited by pain;Patient limited by fatigue Patient left: with family/visitor present;in chair;with call bell/phone within reach;with chair alarm set Nurse Communication: Mobility status PT Visit Diagnosis: History of falling (Z91.81);Difficulty in walking, not elsewhere classified (R26.2);Other abnormalities of gait and mobility (R26.89);Muscle weakness (generalized) (M62.81)     Time: 1610-9604 PT Time Calculation (min) (ACUTE ONLY): 24 min  Charges:  $Therapeutic Activity: 23-37 mins                    G Codes:           Tamala Ser 01/24/2018, 2:17 PM [phone]

## 2018-01-24 NOTE — Progress Notes (Signed)
PROGRESS NOTE [facility]   KEIRSTEN MATUSKA   ZOX:[id number] DOB: 1913-12-21  DOA: 01/21/2018 PCP: Angela Cox, MD   Brief Narrative:  Peggy Webb is a 84 y/o F with Hx of HTN, colon ca, s/p partial colectomy in remission, dementia.  Presented to the her PCP after mechanical fall, outpatient hip x-ray was done and negative.  She was initially doing well but continues to have low back pain and family felt that she was more irritable and confused and therefore was brought to the hospital.  Upon ED evaluation EKG revealed new onset A. fib with RVR, UA grossly abnormal with positive nitrite.  Patient was admitted with working diagnosis of A. fib and UTI.  Subjective: Patient seen and examined, have no complaints today. Pleasantly demented. Overnight agitated, got haldol. Doing well.   Assessment & Plan: A. fib with RVR, new onset CHADSVASc Score 4.  She initially treated with Cardizem drip, went into sinus bradycardia then back to A. fib overnight.  She was started on labetalol.  This morning patient remains in A. fib heart rate well controlled, BP borderline.  Will switch all medications to Cardizem PO,  will help with heart rate and blood pressure control.  Monitor overnight if heart rate under control can DC to rehab in a.m. TSH normal, echo show EF 50-55% with grade 2 diastolic dysfunction Dr. Alvino Chapel discussed with family regarding anticoagulation.  Agree the patient not a great candidate for full anticoagulation given high risk for fall in 84 year old patient.  Risks of a stroke remain high and family understand.  Family okay with baby aspirin for stroke prevention.  UTI  Urine cx poss contaminant  Was treated with IV Rocephin x 2. 1 dose of fosfomycin was given.   HTN  Amlodipine and labetalol - switched to Cardizem  Continue Lotensin   Delirium in settiing dementia  In setting of infection, a fib, memory loss at baseline  Was given Haldol last night.    Deconditioned PT recommending SNF   DVT prophylaxis: Lovenox  Code Status: DNR  Family Communication: Son in Social worker at bedside  Disposition Plan: SNF in AM if HR well controlled in new medication   Consultants:   None  Procedures:   None  Antimicrobials: Anti-infectives (From admission, onward)   Start     Dose/Rate Route Frequency Ordered Stop   01/22/18 1800  cefTRIAXone (ROCEPHIN) 1 g in sodium chloride 0.9 % 100 mL IVPB  Status:  Discontinued     1 g 200 mL/hr over 30 Minutes Intravenous Every 24 hours 01/21/18 1928 01/23/18 0940   01/21/18 1800  cefTRIAXone (ROCEPHIN) 1 g in sodium chloride 0.9 % 100 mL IVPB     1 g 200 mL/hr over 30 Minutes Intravenous  Once 01/21/18 1750 01/21/18 1855        Objective: Vitals:   01/23/18 1046 01/23/18 1319 01/23/18 2108 01/24/18 0515  BP:  130/63 131/75 (!) 122/55  Pulse: 75 71 (!) 107 (!) 110  Resp:  20 20 18   Temp:  97.9 F (36.6 C) 98.7 F (37.1 C) 98.6 F (37 C)  TempSrc:  Oral Oral Oral  SpO2:  97% 97% 97%  Weight:      Height:       No intake or output data in the 24 hours ending 01/24/18 1619 Filed Weights   01/21/18 1929  Weight: 60.3 kg (132 lb 15 oz)    Examination:  General exam: NAD  HEENT: OP moist and clear  Respiratory system: Clear to auscultation. No wheezes,crackle or rhonchi Cardiovascular system: S1S2, Irr Irr rate well controlled. No JVD.  Gastrointestinal system: Abdomen is nondistended, soft and nontender.  Central nervous system: Alert to self and place only.  Extremities: No LE edema  Skin: No rashes.  Psychiatry: Memory loss   Data Reviewed: I have personally reviewed following labs and imaging studies  CBC: Recent Labs  Lab 01/21/18 1519 01/22/18 0620  WBC 8.3 6.3  HGB 14.7 13.8  HCT 42.9 41.1  MCV 93.7 94.7  PLT 215 199   Basic Metabolic Panel: Recent Labs  Lab 01/21/18 1519 01/21/18 1520 01/22/18 0620 01/23/18 0530 01/24/18 0511  NA 139  --  140 138 136  K 3.2*   --  3.4* 3.6 3.6  CL 102  --  105 102 100*  CO2 27  --  27 28 22   GLUCOSE 123*  --  87 127* 106*  BUN 15  --  16 14 19   CREATININE 0.55  --  0.53 0.54 0.69  CALCIUM 9.2  --  8.8* 8.9 9.2  MG  --  2.0  --  1.9  --    GFR: Estimated Creatinine Clearance: 33.3 mL/min (by C-G formula based on SCr of 0.69 mg/dL). Liver Function Tests: No results for input(s): AST, ALT, ALKPHOS, BILITOT, PROT, ALBUMIN in the last 168 hours. No results for input(s): LIPASE, AMYLASE in the last 168 hours. No results for input(s): AMMONIA in the last 168 hours. Coagulation Profile: No results for input(s): INR, PROTIME in the last 168 hours. Cardiac Enzymes: No results for input(s): CKTOTAL, CKMB, CKMBINDEX, TROPONINI in the last 168 hours. BNP (last 3 results) No results for input(s): PROBNP in the last 8760 hours. HbA1C: No results for input(s): HGBA1C in the last 72 hours. CBG: No results for input(s): GLUCAP in the last 168 hours. Lipid Profile: No results for input(s): CHOL, HDL, LDLCALC, TRIG, CHOLHDL, LDLDIRECT in the last 72 hours. Thyroid Function Tests: Recent Labs    01/22/18 0620  TSH 3.256   Anemia Panel: No results for input(s): VITAMINB12, FOLATE, FERRITIN, TIBC, IRON, RETICCTPCT in the last 72 hours. Sepsis Labs: No results for input(s): PROCALCITON, LATICACIDVEN in the last 168 hours.  Recent Results (from the past 240 hour(s))  Urine culture     Status: Abnormal   Collection Time: 01/21/18  3:19 PM  Result Value Ref Range Status   Specimen Description   Final    URINE, RANDOM Performed at [facility], [address] [place], [place] [address]    Special Requests   Final    NONE Performed at [facility], [address] [place], [place] [address]    Culture MULTIPLE SPECIES PRESENT, SUGGEST RECOLLECTION (A)  Final   Report Status 01/22/2018 FINAL  Final  MRSA PCR Screening     Status: None   Collection Time: 01/22/18  6:27 AM   Result Value Ref Range Status   MRSA by PCR NEGATIVE NEGATIVE Final    Comment:        The GeneXpert MRSA Assay (FDA approved for NASAL specimens only), is one component of a comprehensive MRSA colonization surveillance program. It is not intended to diagnose MRSA infection nor to guide or monitor treatment for MRSA infections. Performed at [facility], [address] [place], [place] [address]      Radiology Studies: No results found.   Scheduled Meds: . aspirin  81 mg Oral Daily  . benazepril  20 mg Oral Daily  . diltiazem  90 mg Oral Q12H  . enoxaparin (LOVENOX) injection  40 mg Subcutaneous Q24H   Continuous Infusions:   LOS: 3 days    Time spent: Total of 35 minutes spent with pt, greater than 50% of which was spent in discussion of  treatment, counseling and coordination of care   Latrelle Dodrill, MD Pager: Text Page via [email or web address]   If 7PM-7AM, please contact night-coverage [email or web address] 01/24/2018, 4:19 PM   Note - This record has been created using [employer]. Chart creation errors have been sought, but may not always have been located. Such creation errors do not reflect on the standard of medical care.

## 2018-01-24 NOTE — Care Management Important Message (Signed)
Important Message  Patient Details  Name: Peggy Webb MRN: [id number] Date of Birth: 08/27/1914   Medicare Important Message Given:  Yes    Caren MacadamFuller, Sydny Schnitzler 01/24/2018, 10:09 AMImportant Message  Patient Details  Name: Peggy Webb MRN: [id number] Date of Birth: 03/14/1914   Medicare Important Message Given:  Yes    Caren MacadamFuller, Taneeka Curtner 01/24/2018, 10:09 AM

## 2018-01-25 MED ORDER — DILTIAZEM HCL ER COATED BEADS 240 MG PO CP24: 240.0000 mg | ORAL_CAPSULE | Freq: Every day | ORAL | Status: AC

## 2018-01-25 MED ORDER — DILTIAZEM HCL ER COATED BEADS 240 MG PO CP24
240.0000 mg | ORAL_CAPSULE | Freq: Every day | ORAL | Status: DC
  Administered 2018-01-25: 240 mg via ORAL
  Filled 2018-01-25: qty 1

## 2018-01-25 MED ORDER — ASPIRIN 81 MG PO CHEW: 81.0000 mg | CHEWABLE_TABLET | Freq: Every day | ORAL | Status: AC

## 2018-01-25 NOTE — Progress Notes (Signed)
Occupational Therapy Treatment Patient Details Name: Peggy Webb MRN: [id number] DOB: 02/24/1914 Today's Date: 01/25/2018    History of present illness 84 yo female admitted with Afib with RVR, recent fall, L3 vertebral fx.    OT comments  Pt with bright affect. Agreeable to sitting EOB.  Limited by pain and fatique.  Sitting balance initially fair with cues to weight shift forward then needed max A as she fatiqued. Assisted with combing hair  Follow Up Recommendations  SNF    Equipment Recommendations  (defer to next venue)    Recommendations for Other Services      Precautions / Restrictions Precautions Precautions: Fall Restrictions Weight Bearing Restrictions: No       Mobility Bed Mobility         Supine to sit: Max assist;HOB elevated Sit to supine: Max assist;HOB elevated   General bed mobility comments: assist for legs and trunk  Transfers                 General transfer comment: not attempted today    Balance     Sitting balance-Leahy Scale: (initially fair then poor)   Postural control: Posterior lean                                 ADL either performed or assessed with clinical judgement   ADL    Feeding: son-in-law assisting. Pt tends to pocket food on R   Grooming: Brushing hair;Maximal assistance;Sitting(with support for trunk)                                 General ADL Comments: pt sat eob x 4 minutes with min to max A; fatiques easily. Tends to lean posteriorly. Able to lean forward with cues initially then too fatiqued and back started to hurt     Vision       Perception     Praxis      Cognition Arousal/Alertness: Awake/alert Behavior During Therapy: WFL for tasks assessed/performed Overall Cognitive Status: History of cognitive impairments - at baseline                                 General Comments: HOH, no hearing aides today        Exercises     Shoulder  Instructions       General Comments      Pertinent Vitals/ Pain       Pain Assessment: Faces Faces Pain Scale: Hurts little more Pain Location: low back with activity Pain Descriptors / Indicators: Grimacing Pain Intervention(s): Limited activity within patient's tolerance;Monitored during session;Repositioned  Home Living                                          Prior Functioning/Environment              Frequency           Progress Toward Goals  OT Goals(current goals can now be found in the care plan section)  Progress towards OT goals: Progressing toward goals     Plan      Co-evaluation  AM-PAC PT "6 Clicks" Daily Activity     Outcome Measure   Help from another person eating meals?: A Lot Help from another person taking care of personal grooming?: A Lot Help from another person toileting, which includes using toliet, bedpan, or urinal?: A Lot Help from another person bathing (including washing, rinsing, drying)?: A Lot Help from another person to put on and taking off regular upper body clothing?: A Lot Help from another person to put on and taking off regular lower body clothing?: Total 6 Click Score: 11    End of Session    OT Visit Diagnosis: Muscle weakness (generalized) (M62.81);Unsteadiness on feet (R26.81)   Activity Tolerance Patient limited by pain;Patient limited by fatigue   Patient Left in bed;with call bell/phone within reach;with bed alarm set;with family/visitor present   Nurse Communication          Time: [id number] OT Time Calculation (min): 22 min  Charges: OT General Charges $OT Visit: 1 Visit OT Treatments $Therapeutic Activity: 8-22 mins  Peggy Webb, OTR/L [phone] 01/25/2018   Peggy Webb 01/25/2018, 11:21 AM

## 2018-01-25 NOTE — Clinical Social Work Placement (Signed)
    1:37 PM [facility] and [facility].   LCSW confirmed bed with facility.   LCSW faxed dc docs to facility.  LCSW notified family of transport.  Patient will transport by [facility].  RN report #: [phone]  BKJ  CLINICAL SOCIAL WORK PLACEMENT  NOTE  Date:  01/25/2018  Patient Details  Name: Peggy Webb MRN: [id number] Date of Birth: 04/16/1914  Clinical Social Work is seeking post-discharge placement for this patient at the Skilled  Nursing Facility level of care (*CSW will initial, date and re-position this form in  chart as items are completed):  Yes   Patient/family provided with [facility] Clinical Social Work Department's list of facilities offering this level of care within the geographic area requested by the patient (or if unable, by the patient's family).  Yes   Patient/family informed of their freedom to choose among providers that offer the needed level of care, that participate in Medicare, Medicaid or managed care program needed by the patient, have an available bed and are willing to accept the patient.  Yes   Patient/family informed of [facility]'s ownership interest in [facility] and [facility], as well as of the fact that they are under no obligation to receive care at these facilities.  PASRR submitted to EDS on       PASRR number received on 01/23/18     Existing PASRR number confirmed on       FL2 transmitted to all facilities in geographic area requested by pt/family on 01/23/18     FL2 transmitted to all facilities within larger geographic area on       Patient informed that his/her managed care company has contracts with or will negotiate with certain facilities, including the following:  [facility]     Yes   Patient/family informed of bed offers received.  [facility]     Physician recommends and patient chooses bed at      [facility] 01[facility].  Patient  to be transferred to facility by EMS     Patient family notified on 01/25/18 of transfer.  Name of family member notified:  Gavin Poundeborah      PHYSICIAN Please sign DNR     Additional Comment:    _______________________________________________ Coralyn HellingBernette Tabias Swayze, LCSW 01/25/2018, 1:37 PM

## 2018-01-25 NOTE — Discharge Summary (Signed)
Physician Discharge Summary  Peggy Webb  UJW:[id number]  DOB: 10-03-1914  DOA: 01/21/2018 PCP: Angela Cox, MD  Admit date: 01/21/2018 Discharge date: 01/25/2018  Admitted From: Home  Disposition: Home   Recommendations for Outpatient Follow-up:  1. Follow up with SNF provider at earliest convenience 2. Cardiology follow-up as an outpatient  Discharge Condition: Stable CODE STATUS: DNR Diet recommendation: Heart Healthy   Brief/Interim Summary: For full details see H&P/Progress note, but in brief, Peggy Webb is a 84 y/o F with Hx of HTN, colon ca, s/p partial colectomy in remission, dementia.  Presented to the her PCP after mechanical fall, outpatient hip x-ray was done and negative.  She was initially doing well but continues to have low back pain and family felt that she was more irritable and confused and therefore was brought to the hospital. Upon ED evaluation EKG revealed new onset A. fib with RVR, UA grossly abnormal with positive nitrite. Patient was admitted with working diagnosis of A. Fib with RVR and UTI.  Subjective: Patient seen and examined, no complaints today, working with OT.  Pleasantly demented.  Did not sleep well last night.  Denies chest pain, shortness of breath and palpitation.  Discharge Diagnoses/Hospital Course:  A. fib with RVR, new onset HR well controlled, on and off Afib on telemetry monitor.  CHADSVASc Score 4.  She initially treated with Cardizem drip, went into sinus bradycardia then back to A. fib overnight.  She was treated with labetalol, this was switched to Cardizem PO,  will help with heart rate and blood pressure control.  TSH normal, echo show EF 50-55% with grade 2 diastolic dysfunction Dr. Alvino Chapel discussed with family regarding anticoagulation.  Agree the patient not a great candidate for full anticoagulation given high risk for fall in 84 year old patient.  Risks of a stroke remain high and family understand.  Family okay  with baby aspirin for stroke prevention. Will discharge on Cardizem 240 mg daily.   UTI  Urine cx poss contaminant  Was treated with IV Rocephin x 2 and 1 dose of fosfomycin. Remains afebrile    HTN  Amlodipine and labetalol - switched to Cardizem - BP stable Continue Lotensin   Delirium in settiing dementia  In setting of infection, a fib, memory loss at baseline  Stable, re-orient   Deconditioned PT recommending SNF   All other chronic medical condition were stable during the hospitalization.  Patient was seen by physical therapy, recommending SNF  On the day of the discharge the patient's vitals were stable, and no other acute medical condition were reported by patient. the patient was felt safe to be discharge to SNF   Discharge Instructions  You were cared for by a hospitalist during your hospital stay. If you have any questions about your discharge medications or the care you received while you were in the hospital after you are discharged, you can call the unit and asked to speak with the hospitalist on call if the hospitalist that took care of you is not available. Once you are discharged, your primary care physician will handle any further medical issues. Please note that NO REFILLS for any discharge medications will be authorized once you are discharged, as it is imperative that you return to your primary care physician (or establish a relationship with a primary care physician if you do not have one) for your aftercare needs so that they can reassess your need for medications and monitor your lab values.  Discharge Instructions  Amb referral to AFIB Clinic   Complete by:  As directed    Call MD for:  difficulty breathing, headache or visual disturbances   Complete by:  As directed    Call MD for:  extreme fatigue   Complete by:  As directed    Call MD for:  hives   Complete by:  As directed    Call MD for:  persistant dizziness or light-headedness   Complete by:   As directed    Call MD for:  persistant nausea and vomiting   Complete by:  As directed    Call MD for:  redness, tenderness, or signs of infection (pain, swelling, redness, odor or green/yellow discharge around incision site)   Complete by:  As directed    Call MD for:  severe uncontrolled pain   Complete by:  As directed    Call MD for:  temperature >100.4   Complete by:  As directed    Diet - low sodium heart healthy   Complete by:  As directed    Increase activity slowly   Complete by:  As directed      Allergies as of 01/25/2018      Reactions   Shrimp [shellfish Allergy] Nausea And Vomiting      Medication List    STOP taking these medications   amLODipine 10 MG tablet Commonly known as:  NORVASC   ibuprofen 200 MG tablet Commonly known as:  ADVIL,MOTRIN   labetalol 200 MG tablet Commonly known as:  NORMODYNE     TAKE these medications   aspirin 81 MG chewable tablet Chew 1 tablet (81 mg total) by mouth daily. Start taking on:  01/26/2018   benazepril 20 MG tablet Commonly known as:  LOTENSIN Take 20 mg by mouth daily.   diltiazem 240 MG 24 hr capsule Commonly known as:  CARDIZEM CD Take 1 capsule (240 mg total) by mouth daily.       Allergies  Allergen Reactions  . Shrimp [Shellfish Allergy] Nausea And Vomiting    Consultations:     Procedures/Studies: Dg Chest 2 View  Result Date: 01/21/2018 CLINICAL DATA:  Shortness of breath. EXAM: CHEST - 2 VIEW COMPARISON:  Radiograph of February 20, 2010. FINDINGS: Stable cardiomegaly. Atherosclerosis of thoracic aorta is noted. Stable calcified left hilar lymph node. No pneumothorax or pleural effusion is noted. No acute pulmonary disease is noted. Bony thorax is unremarkable. IMPRESSION: No active cardiopulmonary disease. Aortic Atherosclerosis (ICD10-I70.0). Electronically Signed   By: Lupita RaiderJames  Green Jr, M.D.   On: 01/21/2018 17:06   Dg Lumbar Spine Complete  Result Date: 01/21/2018 CLINICAL DATA:  Acute low  back pain after fall 2 days ago. EXAM: LUMBAR SPINE - COMPLETE 4+ VIEW COMPARISON:  CT scan of October 11, 2006. FINDINGS: Diffuse osteopenia is noted. No spondylolisthesis is noted. Moderate superior endplate depression of L3 vertebral body is noted most consistent with old fracture. Moderate degenerative disc disease is noted at L4-5 and L5-S1. Degenerative changes seen involving posterior facet joints of L5-S1. IMPRESSION: Probable old L3 vertebral body fracture is noted. Multilevel degenerative disc disease. No acute abnormality seen in the lumbar spine. Electronically Signed   By: Lupita RaiderJames  Green Jr, M.D.   On: 01/21/2018 14:42     Discharge Exam: Vitals:   01/24/18 2020 01/25/18 0624  BP: 126/79 138/75  Pulse: 83 74  Resp: 18 18  Temp: 98.4 F (36.9 C) 97.6 F (36.4 C)  SpO2: 93% 98%   Vitals:   01/24/18  0515 01/24/18 1912 01/24/18 2020 01/25/18 0624  BP: (!) 122/55 138/76 126/79 138/75  Pulse: (!) 110 (!) 150 83 74  Resp: 18  18 18   Temp: 98.6 F (37 C)  98.4 F (36.9 C) 97.6 F (36.4 C)  TempSrc: Oral  Oral Oral  SpO2: 97%  93% 98%  Weight:      Height:        General: NAD, confused  Cardiovascular: RRR, S1/S2 + Respiratory: CTA bilaterally, no wheezing, no rhonchi Abdominal: Soft, NT, ND, bowel sounds + Extremities: no edema   The results of significant diagnostics from this hospitalization (including imaging, microbiology, ancillary and laboratory) are listed below for reference.     Microbiology: Recent Results (from the past 240 hour(s))  Urine culture     Status: Abnormal   Collection Time: 01/21/18  3:19 PM  Result Value Ref Range Status   Specimen Description   Final    URINE, RANDOM Performed at [facility], [address], [place], [place] [address]    Special Requests   Final    NONE Performed at [facility], [address], [place], [place] [address]    Culture MULTIPLE SPECIES PRESENT, SUGGEST RECOLLECTION  (A)  Final   Report Status 01/22/2018 FINAL  Final  MRSA PCR Screening     Status: None   Collection Time: 01/22/18  6:27 AM  Result Value Ref Range Status   MRSA by PCR NEGATIVE NEGATIVE Final    Comment:        The GeneXpert MRSA Assay (FDA approved for NASAL specimens only), is one component of a comprehensive MRSA colonization surveillance program. It is not intended to diagnose MRSA infection nor to guide or monitor treatment for MRSA infections. Performed at [facility], [address] [place], [place] [address]      Labs: BNP (last 3 results) No results for input(s): BNP in the last 8760 hours. Basic Metabolic Panel: Recent Labs  Lab 01/21/18 1519 01/21/18 1520 01/22/18 0620 01/23/18 0530 01/24/18 0511  NA 139  --  140 138 136  K 3.2*  --  3.4* 3.6 3.6  CL 102  --  105 102 100*  CO2 27  --  27 28 22   GLUCOSE 123*  --  87 127* 106*  BUN 15  --  16 14 19   CREATININE 0.55  --  0.53 0.54 0.69  CALCIUM 9.2  --  8.8* 8.9 9.2  MG  --  2.0  --  1.9  --    Liver Function Tests: No results for input(s): AST, ALT, ALKPHOS, BILITOT, PROT, ALBUMIN in the last 168 hours. No results for input(s): LIPASE, AMYLASE in the last 168 hours. No results for input(s): AMMONIA in the last 168 hours. CBC: Recent Labs  Lab 01/21/18 1519 01/22/18 0620  WBC 8.3 6.3  HGB 14.7 13.8  HCT 42.9 41.1  MCV 93.7 94.7  PLT 215 199   Cardiac Enzymes: No results for input(s): CKTOTAL, CKMB, CKMBINDEX, TROPONINI in the last 168 hours. BNP: Invalid input(s): POCBNP CBG: No results for input(s): GLUCAP in the last 168 hours. D-Dimer No results for input(s): DDIMER in the last 72 hours. Hgb A1c No results for input(s): HGBA1C in the last 72 hours. Lipid Profile No results for input(s): CHOL, HDL, LDLCALC, TRIG, CHOLHDL, LDLDIRECT in the last 72 hours. Thyroid function studies No results for input(s): TSH, T4TOTAL, T3FREE, THYROIDAB in the last 72 hours.  Invalid  input(s): FREET3  Anemia work up No results for input(s): VITAMINB12, FOLATE, FERRITIN, TIBC, IRON, RETICCTPCT in the last 72 hours. Urinalysis    Component Value Date/Time   COLORURINE YELLOW 01/21/2018 1519   APPEARANCEUR CLEAR 01/21/2018 1519   LABSPEC 1.005 01/21/2018 1519   PHURINE 6.0 01/21/2018 1519   GLUCOSEU NEGATIVE 01/21/2018 1519   HGBUR SMALL (A) 01/21/2018 1519   BILIRUBINUR NEGATIVE 01/21/2018 1519   KETONESUR NEGATIVE 01/21/2018 1519   PROTEINUR NEGATIVE 01/21/2018 1519   UROBILINOGEN 0.2 02/20/2010 1403   NITRITE POSITIVE (A) 01/21/2018 1519   LEUKOCYTESUR NEGATIVE 01/21/2018 1519   Sepsis Labs Invalid input(s): PROCALCITONIN,  WBC,  LACTICIDVEN Microbiology Recent Results (from the past 240 hour(s))  Urine culture     Status: Abnormal   Collection Time: 01/21/18  3:19 PM  Result Value Ref Range Status   Specimen Description   Final    URINE, RANDOM Performed at [facility], [address] [place], [place] [address]    Special Requests   Final    NONE Performed at [facility], [address] [place], [place] [address]    Culture MULTIPLE SPECIES PRESENT, SUGGEST RECOLLECTION (A)  Final   Report Status 01/22/2018 FINAL  Final  MRSA PCR Screening     Status: None   Collection Time: 01/22/18  6:27 AM  Result Value Ref Range Status   MRSA by PCR NEGATIVE NEGATIVE Final    Comment:        The GeneXpert MRSA Assay (FDA approved for NASAL specimens only), is one component of a comprehensive MRSA colonization surveillance program. It is not intended to diagnose MRSA infection nor to guide or monitor treatment for MRSA infections. Performed at [facility], [address] [place], [place] [address]      Time coordinating discharge: 35 minutes  SIGNED:  Latrelle Dodrill, MD  [facility] 01/25/2018, 12:08 PM  Pager please text page via  [email or web address]  Note - This record has been  created using [employer]. Chart creation errors have been sought, but may not always have been located. Such creation errors do not reflect on the standard of medical care.

## 2018-01-25 NOTE — Care Management Note (Signed)
Case Management Note  Patient Details  Name: Peggy Webb MRN: [id number] Date of Birth: 05/02/1914  Subjective/Objective:                    Action/Plan:d/c SNF   Expected Discharge Date:  01/25/18               Expected Discharge Plan:  Skilled Nursing Facility  In-House Referral:  Clinical Social Work  Discharge planning Services  CM Consult  Post Acute Care Choice:    Choice offered to:     DME Arranged:    DME Agency:     HH Arranged:    HH Agency:     Status of Service:  Completed, signed off  If discussed at [employer] Meetings, dates discussed:    Additional Comments:  Lanier ClamMahabir, Chasity Outten, RN 01/25/2018, 12:59 PM

## 2018-02-14 ENCOUNTER — Emergency Department (HOSPITAL_COMMUNITY): Payer: Medicare Other

## 2018-02-14 ENCOUNTER — Encounter (HOSPITAL_COMMUNITY): Payer: Self-pay | Admitting: Internal Medicine

## 2018-02-14 ENCOUNTER — Inpatient Hospital Stay (HOSPITAL_COMMUNITY)
Admission: EM | Admit: 2018-02-14 | Discharge: 2018-02-16 | DRG: 690 | Disposition: A | Discharge status: Skilled Nursing Facility | Payer: Medicare Other | Attending: Internal Medicine | Admitting: Internal Medicine

## 2018-02-14 DIAGNOSIS — I4891 Unspecified atrial fibrillation: Secondary | ICD-10-CM | POA: Diagnosis present

## 2018-02-14 DIAGNOSIS — Z66 Do not resuscitate: Secondary | ICD-10-CM | POA: Diagnosis present

## 2018-02-14 DIAGNOSIS — I10 Essential (primary) hypertension: Secondary | ICD-10-CM | POA: Diagnosis present

## 2018-02-14 DIAGNOSIS — B372 Candidiasis of skin and nail: Secondary | ICD-10-CM | POA: Diagnosis present

## 2018-02-14 DIAGNOSIS — I480 Paroxysmal atrial fibrillation: Secondary | ICD-10-CM | POA: Diagnosis not present

## 2018-02-14 DIAGNOSIS — R413 Other amnesia: Secondary | ICD-10-CM | POA: Diagnosis not present

## 2018-02-14 DIAGNOSIS — R748 Abnormal levels of other serum enzymes: Secondary | ICD-10-CM

## 2018-02-14 DIAGNOSIS — F0280 Dementia in other diseases classified elsewhere without behavioral disturbance: Secondary | ICD-10-CM | POA: Diagnosis present

## 2018-02-14 DIAGNOSIS — M545 Low back pain, unspecified: Secondary | ICD-10-CM | POA: Diagnosis present

## 2018-02-14 DIAGNOSIS — M5136 Other intervertebral disc degeneration, lumbar region: Secondary | ICD-10-CM | POA: Diagnosis present

## 2018-02-14 DIAGNOSIS — R410 Disorientation, unspecified: Secondary | ICD-10-CM

## 2018-02-14 DIAGNOSIS — E860 Dehydration: Secondary | ICD-10-CM | POA: Diagnosis present

## 2018-02-14 DIAGNOSIS — Z974 Presence of external hearing-aid: Secondary | ICD-10-CM

## 2018-02-14 DIAGNOSIS — Z9181 History of falling: Secondary | ICD-10-CM

## 2018-02-14 DIAGNOSIS — I444 Left anterior fascicular block: Secondary | ICD-10-CM | POA: Diagnosis present

## 2018-02-14 DIAGNOSIS — R000 Tachycardia, unspecified: Secondary | ICD-10-CM | POA: Diagnosis present

## 2018-02-14 DIAGNOSIS — R778 Other specified abnormalities of plasma proteins: Secondary | ICD-10-CM | POA: Diagnosis present

## 2018-02-14 DIAGNOSIS — W19XXXA Unspecified fall, initial encounter: Secondary | ICD-10-CM | POA: Diagnosis present

## 2018-02-14 DIAGNOSIS — Z85038 Personal history of other malignant neoplasm of large intestine: Secondary | ICD-10-CM

## 2018-02-14 DIAGNOSIS — I482 Chronic atrial fibrillation: Secondary | ICD-10-CM | POA: Diagnosis present

## 2018-02-14 DIAGNOSIS — N390 Urinary tract infection, site not specified: Secondary | ICD-10-CM | POA: Diagnosis not present

## 2018-02-14 DIAGNOSIS — S32039A Unspecified fracture of third lumbar vertebra, initial encounter for closed fracture: Secondary | ICD-10-CM | POA: Diagnosis present

## 2018-02-14 DIAGNOSIS — R7989 Other specified abnormal findings of blood chemistry: Secondary | ICD-10-CM | POA: Diagnosis present

## 2018-02-14 DIAGNOSIS — E876 Hypokalemia: Secondary | ICD-10-CM | POA: Diagnosis not present

## 2018-02-14 DIAGNOSIS — G3183 Dementia with Lewy bodies: Secondary | ICD-10-CM | POA: Diagnosis present

## 2018-02-14 DIAGNOSIS — H9190 Unspecified hearing loss, unspecified ear: Secondary | ICD-10-CM | POA: Diagnosis present

## 2018-02-14 HISTORY — DX: Other specified abnormal findings of blood chemistry: R79.89

## 2018-02-14 HISTORY — DX: Unspecified atrial fibrillation: I48.91

## 2018-02-14 HISTORY — DX: Hypokalemia: E87.6

## 2018-02-14 HISTORY — DX: Other specified abnormalities of plasma proteins: R77.8

## 2018-02-14 LAB — URINALYSIS, ROUTINE W REFLEX MICROSCOPIC
BILIRUBIN URINE: NEGATIVE
Glucose, UA: NEGATIVE mg/dL
KETONES UR: NEGATIVE mg/dL
Nitrite: POSITIVE — AB
PROTEIN: NEGATIVE mg/dL
Specific Gravity, Urine: 1.01 (ref 1.005–1.030)
pH: 6 (ref 5.0–8.0)

## 2018-02-14 LAB — COMPREHENSIVE METABOLIC PANEL
ALBUMIN: 2.9 g/dL — AB (ref 3.5–5.0)
ALK PHOS: 94 U/L (ref 38–126)
ALT: 16 U/L (ref 14–54)
ANION GAP: 13 (ref 5–15)
AST: 25 U/L (ref 15–41)
BILIRUBIN TOTAL: 0.8 mg/dL (ref 0.3–1.2)
BUN: 23 mg/dL — ABNORMAL HIGH (ref 6–20)
CALCIUM: 8.5 mg/dL — AB (ref 8.9–10.3)
CO2: 26 mmol/L (ref 22–32)
Chloride: 93 mmol/L — ABNORMAL LOW (ref 101–111)
Creatinine, Ser: 0.94 mg/dL (ref 0.44–1.00)
GFR calc non Af Amer: 47 mL/min — ABNORMAL LOW (ref >60)
GFR, EST AFRICAN AMERICAN: 55 mL/min — AB (ref >60)
GLUCOSE: 131 mg/dL — AB (ref 65–99)
Potassium: 2.7 mmol/L — CL (ref 3.5–5.1)
Sodium: 132 mmol/L — ABNORMAL LOW (ref 135–145)
TOTAL PROTEIN: 5.6 g/dL — AB (ref 6.5–8.1)

## 2018-02-14 LAB — URINALYSIS, MICROSCOPIC (REFLEX)

## 2018-02-14 LAB — CBG MONITORING, ED
GLUCOSE-CAPILLARY: 126 mg/dL — AB (ref 65–99)
Glucose-Capillary: 96 mg/dL (ref 65–99)

## 2018-02-14 LAB — CBC
HCT: 38.4 % (ref 36.0–46.0)
HEMOGLOBIN: 12.9 g/dL (ref 12.0–15.0)
MCH: 30.9 pg (ref 26.0–34.0)
MCHC: 33.6 g/dL (ref 30.0–36.0)
MCV: 91.9 fL (ref 78.0–100.0)
Platelets: 231 10*3/uL (ref 150–400)
RBC: 4.18 MIL/uL (ref 3.87–5.11)
RDW: 12.7 % (ref 11.5–15.5)
WBC: 13.3 10*3/uL — ABNORMAL HIGH (ref 4.0–10.5)

## 2018-02-14 LAB — TROPONIN I: TROPONIN I: 0.07 ng/mL — AB (ref <0.03)

## 2018-02-14 LAB — MAGNESIUM: MAGNESIUM: 1.8 mg/dL (ref 1.7–2.4)

## 2018-02-14 MED ORDER — SODIUM CHLORIDE 0.9 % IV SOLN
INTRAVENOUS | Status: DC
  Administered 2018-02-14: 125 mL/h via INTRAVENOUS

## 2018-02-14 MED ORDER — ACETAMINOPHEN 325 MG PO TABS: 650.0000 mg | ORAL_TABLET | Freq: Four times a day (QID) | ORAL | Status: DC | PRN

## 2018-02-14 MED ORDER — DILTIAZEM HCL ER COATED BEADS 120 MG PO CP24
240.0000 mg | ORAL_CAPSULE | Freq: Every day | ORAL | Status: DC
  Administered 2018-02-14: 240 mg via ORAL
  Filled 2018-02-14: qty 2

## 2018-02-14 MED ORDER — SENNOSIDES-DOCUSATE SODIUM 8.6-50 MG PO TABS: 1.0000 | ORAL_TABLET | Freq: Every evening | ORAL | Status: DC | PRN

## 2018-02-14 MED ORDER — ENOXAPARIN SODIUM 40 MG/0.4ML ~~LOC~~ SOLN
40.0000 mg | SUBCUTANEOUS | Status: DC
  Administered 2018-02-15 – 2018-02-16 (×2): 40 mg via SUBCUTANEOUS
  Filled 2018-02-14 (×3): qty 0.4

## 2018-02-14 MED ORDER — LORAZEPAM 2 MG/ML IJ SOLN
0.5000 mg | Freq: Once | INTRAMUSCULAR | Status: AC
Start: 2018-02-14 — End: 2018-02-14
  Administered 2018-02-14: 0.5 mg via INTRAVENOUS
  Filled 2018-02-14: qty 1

## 2018-02-14 MED ORDER — SODIUM CHLORIDE 0.9 % IV BOLUS
500.0000 mL | Freq: Once | INTRAVENOUS | Status: AC
Start: 2018-02-14 — End: 2018-02-14
  Administered 2018-02-14: 500 mL via INTRAVENOUS

## 2018-02-14 MED ORDER — DILTIAZEM HCL 100 MG IV SOLR
5.0000 mg/h | INTRAVENOUS | Status: DC
  Filled 2018-02-14: qty 100

## 2018-02-14 MED ORDER — ONDANSETRON HCL 4 MG PO TABS: 4.0000 mg | ORAL_TABLET | Freq: Four times a day (QID) | ORAL | Status: DC | PRN

## 2018-02-14 MED ORDER — ACETAMINOPHEN 650 MG RE SUPP: 650.0000 mg | Freq: Four times a day (QID) | RECTAL | Status: DC | PRN

## 2018-02-14 MED ORDER — POTASSIUM CHLORIDE CRYS ER 20 MEQ PO TBCR
20.0000 meq | EXTENDED_RELEASE_TABLET | Freq: Two times a day (BID) | ORAL | Status: DC
Start: 2018-02-14 — End: 2018-02-17
  Administered 2018-02-15 – 2018-02-16 (×3): 20 meq via ORAL
  Filled 2018-02-14 (×3): qty 1

## 2018-02-14 MED ORDER — METOPROLOL TARTRATE 5 MG/5ML IV SOLN
5.0000 mg | INTRAVENOUS | Status: AC
  Administered 2018-02-14: 5 mg via INTRAVENOUS
  Filled 2018-02-14: qty 5

## 2018-02-14 MED ORDER — POTASSIUM CHLORIDE 2 MEQ/ML IV SOLN
INTRAVENOUS | Status: DC
  Administered 2018-02-14 – 2018-02-15 (×2): via INTRAVENOUS
  Filled 2018-02-14 (×5): qty 1000

## 2018-02-14 MED ORDER — ONDANSETRON HCL 4 MG/2ML IJ SOLN
4.0000 mg | Freq: Four times a day (QID) | INTRAMUSCULAR | Status: DC | PRN
  Administered 2018-02-16: 4 mg via INTRAVENOUS
  Filled 2018-02-14: qty 2

## 2018-02-14 MED ORDER — POTASSIUM CHLORIDE 10 MEQ/100ML IV SOLN
10.0000 meq | INTRAVENOUS | Status: AC
  Administered 2018-02-14 (×3): 10 meq via INTRAVENOUS
  Filled 2018-02-14 (×3): qty 100

## 2018-02-14 MED ORDER — POTASSIUM CHLORIDE CRYS ER 20 MEQ PO TBCR
60.0000 meq | EXTENDED_RELEASE_TABLET | Freq: Once | ORAL | Status: DC
  Filled 2018-02-14: qty 3

## 2018-02-14 MED ORDER — HYDROCODONE-ACETAMINOPHEN 5-325 MG PO TABS
1.0000 | ORAL_TABLET | Freq: Four times a day (QID) | ORAL | Status: DC | PRN
  Administered 2018-02-16: 1 via ORAL
  Filled 2018-02-14: qty 1

## 2018-02-14 MED ORDER — DILTIAZEM LOAD VIA INFUSION
5.0000 mg | Freq: Once | INTRAVENOUS | Status: DC
  Filled 2018-02-14: qty 5

## 2018-02-14 NOTE — ED Provider Notes (Addendum)
[facility] EMERGENCY DEPARTMENT Provider Note   CSN: [id number] Arrival date & time: 02/14/18  1102     History   Chief Complaint Chief Complaint  Patient presents with  . Altered Mental Status    HPI Peggy Webb is a 84 y.o. female.  84 year old female with history of dementia presents with increasing confusion since today.  Last seen normal was last night.  According to family patient seems to be less oriented to person.  No reported fever or chills.  No recent urinary symptoms.  No cough or congestion.  Patient is normally oriented to person and place.  EMS called and patient was hypotensive on arrival blood pressure of 80 and received 150 cc of saline.  Blood pressure improved to 120s.  Patient had a recent hospitalization for A. fib with RVR and was placed on diltiazem.  No recent reported history of trauma.  Does not take any blood thinners.     Past Medical History:  Diagnosis Date  . History of colon cancer   . HTN (hypertension)   . Memory loss     Patient Active Problem List   Diagnosis Date Noted  . Atrial fibrillation with RVR (HCC) 01/21/2018    Past Surgical History:  Procedure Laterality Date  . COLON SURGERY     partial colectomy due to colon cancer > 15-20 years ago     OB History   None      Home Medications    Prior to Admission medications   Medication Sig Start Date End Date Taking? Authorizing Provider  aspirin 81 MG chewable tablet Chew 1 tablet (81 mg total) by mouth daily. 01/26/18   Lenox Ponds, MD  benazepril (LOTENSIN) 20 MG tablet Take 20 mg by mouth daily. 11/13/17   [provider]  diltiazem (CARDIZEM CD) 240 MG 24 hr capsule Take 1 capsule (240 mg total) by mouth daily. 01/25/18   Lenox Ponds, MD    Family History No family history on file.  Social History Social History   Tobacco Use  . Smoking status: Never Smoker  . Smokeless tobacco: Never Used  Substance Use Topics  .  Alcohol use: Never    Frequency: Never  . Drug use: Never     Allergies   Shrimp [shellfish allergy]   Review of Systems Review of Systems  All other systems reviewed and are negative.    Physical Exam Updated Vital Signs BP 98/68 (BP Location: Right Arm)   Pulse (!) 132   Temp (!) 97.5 F (36.4 C) (Oral)   Resp (!) 23   SpO2 98%   Physical Exam  Constitutional: She is oriented to person, place, and time. She appears well-developed and well-nourished.  Non-toxic appearance. No distress.  HENT:  Head: Normocephalic and atraumatic.  Eyes: Pupils are equal, round, and reactive to light. Conjunctivae, EOM and lids are normal.  Neck: Normal range of motion. Neck supple. No tracheal deviation present. No thyroid mass present.  Cardiovascular: Normal heart sounds. An irregularly irregular rhythm present. Tachycardia present. Exam reveals no gallop.  No murmur heard. Pulmonary/Chest: Effort normal and breath sounds normal. No stridor. No respiratory distress. She has no decreased breath sounds. She has no wheezes. She has no rhonchi. She has no rales.  Abdominal: Soft. Normal appearance and bowel sounds are normal. She exhibits no distension. There is no tenderness. There is no rebound and no CVA tenderness.  Musculoskeletal: Normal range of motion. She exhibits no edema  or tenderness.  Neurological: She is alert and oriented to person, place, and time. She displays atrophy. She displays no tremor. No cranial nerve deficit or sensory deficit. GCS eye subscore is 4. GCS verbal subscore is 5. GCS motor subscore is 6.  Skin: Skin is warm and dry. No abrasion and no rash noted.  Psychiatric: She has a normal mood and affect. Her behavior is normal. Her speech is delayed.  Nursing note and vitals reviewed.    ED Treatments / Results  Labs (all labs ordered are listed, but only abnormal results are displayed) Labs Reviewed  URINE CULTURE  COMPREHENSIVE METABOLIC PANEL  CBC    URINALYSIS, ROUTINE W REFLEX MICROSCOPIC  CBG MONITORING, ED    EKG EKG Interpretation  Date/Time:  Wednesday February 14 2018 11:02:35 EDT Ventricular Rate:  111 PR Interval:    QRS Duration: 119 QT Interval:  380 QTC Calculation: 517 R Axis:   -67 Text Interpretation:  Atrial fibrillation Left anterior fascicular block LVH with secondary repolarization abnormality Anterior infarct, old No significant change since last tracing Confirmed by Lorre NickAllen, Saahas Hidrogo ([id number]) on 02/14/2018 1:00:26 PM   Radiology No results found.  Procedures Procedures (including critical care time)  Medications Ordered in ED Medications  0.9 %  sodium chloride infusion (has no administration in time range)  sodium chloride 0.9 % bolus 500 mL (has no administration in time range)     Initial Impression / Assessment and Plan / ED Course  I have reviewed the triage vital signs and the nursing notes.  Pertinent labs & imaging results that were available during my care of the patient were reviewed by me and considered in my medical decision making (see chart for details).    Patient presented here with A. fib which is chronic and slight increased rate which responded well to IV fluids.  Patient also with hypokalemia that was treated with oral potassium.  Does have evidence of a bump in her troponin.  Patient's EKG is unchanged from prior.  Could be from demand ischemia but due to patient's increased weakness will consult hospitalist for 23-hour observation and serial troponins.  Final Clinical Impressions(s) / ED Diagnoses   Final diagnoses:  None    ED Discharge Orders    None       Lorre NickAllen, Alieah Brinton, MD 02/14/18 1428    Lorre NickAllen, Jentri Aye, MD 02/14/18 1438

## 2018-02-14 NOTE — H&P (Signed)
History and Physical    Peggy Webb:[id number] DOB: 1914-02-06 DOA: 02/14/2018  PCP: Angela Cox, MD Patient coming from: facility  Chief Complaint: ams  HPI: [name] is a pleasantly demented 84 y.o. female with medical history significant hypertension, colon cancer, atrial fibrillation since emergency department with the chief complaint of altered mental status. Initial evaluation reveals hypokalemia as well as elevated troponin.[facility] were asked to admit  Information is obtained from the chart and the daughter who is at the bedside.daughter reports she visited her mother last night and she seemed at her baseline which is mostly bedbound unit to self incontinent pretty much full assess. Daughter denies any recent illness fever chills cough or congestion. No complaints of abdominal pain nausea vomiting diarrhea constipation. Daughter reports the staff noted this morningthat she was somewhat lethargic and more confused than at her baseline. EMS was called and reportedly is covered in blood pressure with a systolic in the 80s. She was given normal saline bolus of 150 mL and her blood pressure rose 120 systolic. Of note she had recent hospitalization for atrial fibrillation with rapid ventricular response. At that time she was placed on diltiazem.Her denies any recent trauma. She has been at the facility participating in rehabilitation with the hopes of getting back home but daughter states patient has not been progressing in physical therapy.    ED Course: in the emergency department she is afebrile hemodynamically stable with a heart rate of 132 initially. KG reveals atrial fibrillation. She is provided with normal saline bolus and at the time of admission her heart rate is 95. She's not hypoxic.  Review of Systems: As per HPI otherwise all other systems reviewed and are negative.   Ambulatory Status: patient has been mostly wheelchair bound since her recent  hospitalization. She is trying physical therapy at facility but she has gotten progressively weaker  Past Medical History:  Diagnosis Date  . A-fib (HCC)   . Elevated troponin   . History of colon cancer   . HTN (hypertension)   . Hypokalemia   . Memory loss     Past Surgical History:  Procedure Laterality Date  . COLON SURGERY     partial colectomy due to colon cancer > 15-20 years ago    Social History   Socioeconomic History  . Marital status: Widowed    Spouse name: Not on file  . Number of children: Not on file  . Years of education: Not on file  . Highest education level: Not on file  Occupational History  . Not on file  Social Needs  . Financial resource strain: Not on file  . Food insecurity:    Worry: Not on file    Inability: Not on file  . Transportation needs:    Medical: Not on file    Non-medical: Not on file  Tobacco Use  . Smoking status: Never Smoker  . Smokeless tobacco: Never Used  Substance and Sexual Activity  . Alcohol use: Never    Frequency: Never  . Drug use: Never  . Sexual activity: Not Currently  Lifestyle  . Physical activity:    Days per week: Not on file    Minutes per session: Not on file  . Stress: Not on file  Relationships  . Social connections:    Talks on phone: Not on file    Gets together: Not on file    Attends religious service: Not on file    Active member of  club or organization: Not on file    Attends meetings of clubs or organizations: Not on file    Relationship status: Not on file  . Intimate partner violence:    Fear of current or ex partner: Not on file    Emotionally abused: Not on file    Physically abused: Not on file    Forced sexual activity: Not on file  Other Topics Concern  . Not on file  Social History Narrative  . Not on file    Allergies  Allergen Reactions  . Shrimp [Shellfish Allergy] Nausea And Vomiting    No family history on file.family medical history reviewed and noncontributory  to the admission of this elderly lady  Prior to Admission medications   Medication Sig Start Date End Date Taking? Authorizing Provider  acetaminophen (TYLENOL) 325 MG tablet Take 650 mg by mouth every 6 (six) hours as needed for mild pain. X 14 days start date 02-09-18 end date 02-23-18   Yes [provider]  aspirin 81 MG chewable tablet Chew 1 tablet (81 mg total) by mouth daily. 01/26/18  Yes Randel PiggSilva Zapata, Dorma RussellEdwin, MD  diltiazem (CARDIZEM CD) 240 MG 24 hr capsule Take 1 capsule (240 mg total) by mouth daily. 01/25/18  Yes Randel PiggSilva Zapata, Dorma RussellEdwin, MD  NONFORMULARY OR COMPOUNDED ITEM Apply 1 application topically every 12 (twelve) hours. "TAC 0.1%, ZINC, NYSTATIN ointment 1:1:1 - apply to peri area every shift for MASD   Yes [provider]  potassium chloride SA (K-DUR,KLOR-CON) 20 MEQ tablet Take 20 mEq by mouth 2 (two) times daily.   Yes [provider]    Physical Exam: Vitals:   02/14/18 1315 02/14/18 1330 02/14/18 1430 02/14/18 1500  BP: 118/61 114/63 (!) 107/56 112/73  Pulse: 95 93 88 (!) 58  Resp: (!) 21 18 16 16   Temp:      TempSrc:      SpO2: 98% 98% 98% 100%     General:  Appears calm and comfortable slightly pale in no acute distress Eyes:  PERRL, EOMI, normal lids, iris ENT:  grossly normal hearing, lips & tongue, because membranes of her mouth are pink slightly dry Neck:  no LAD, masses or thyromegaly Cardiovascular:  Irregularly irregular no m/r/g. No LE edema.  Respiratory:  Normal effort breath sounds are slightly shallow hear no crackles no wheezes Abdomen:  soft, + bowel sounds but sluggish mild tenderness to palpation particularly in the lower right-hand quadrant guarding or rebounding Skin:  no rash or induration seen on limited exam Musculoskeletal:  grossly normal tone BUE/BLE, good ROM, no bony abnormality joints without swelling/erythema Psychiatric:  grossly normal mood and affect, speech fluent and appropriate, AOx3 Neurologic:  CN 2-12  grossly intact, moves all extremities in coordinated fashion, sensation intact oriented to self only speech is slow soft but clear. No facial droop he attempts to follow commands  Labs on Admission: I have personally reviewed following labs and imaging studies  CBC: Recent Labs  Lab 02/14/18 1127  WBC 13.3*  HGB 12.9  HCT 38.4  MCV 91.9  PLT 231   Basic Metabolic Panel: Recent Labs  Lab 02/14/18 1127  NA 132*  K 2.7*  CL 93*  CO2 26  GLUCOSE 131*  BUN 23*  CREATININE 0.94  CALCIUM 8.5*   GFR: CrCl cannot be calculated (Unknown ideal weight.). Liver Function Tests: Recent Labs  Lab 02/14/18 1127  AST 25  ALT 16  ALKPHOS 94  BILITOT 0.8  PROT 5.6*  ALBUMIN  2.9*   No results for input(s): LIPASE, AMYLASE in the last 168 hours. No results for input(s): AMMONIA in the last 168 hours. Coagulation Profile: No results for input(s): INR, PROTIME in the last 168 hours. Cardiac Enzymes: Recent Labs  Lab 02/14/18 1127  TROPONINI 0.07*   BNP (last 3 results) No results for input(s): PROBNP in the last 8760 hours. HbA1C: No results for input(s): HGBA1C in the last 72 hours. CBG: Recent Labs  Lab 02/14/18 1123  GLUCAP 126*   Lipid Profile: No results for input(s): CHOL, HDL, LDLCALC, TRIG, CHOLHDL, LDLDIRECT in the last 72 hours. Thyroid Function Tests: No results for input(s): TSH, T4TOTAL, FREET4, T3FREE, THYROIDAB in the last 72 hours. Anemia Panel: No results for input(s): VITAMINB12, FOLATE, FERRITIN, TIBC, IRON, RETICCTPCT in the last 72 hours. Urine analysis:    Component Value Date/Time   COLORURINE YELLOW 01/21/2018 1519   APPEARANCEUR CLEAR 01/21/2018 1519   LABSPEC 1.005 01/21/2018 1519   PHURINE 6.0 01/21/2018 1519   GLUCOSEU NEGATIVE 01/21/2018 1519   HGBUR SMALL (A) 01/21/2018 1519   BILIRUBINUR NEGATIVE 01/21/2018 1519   KETONESUR NEGATIVE 01/21/2018 1519   PROTEINUR NEGATIVE 01/21/2018 1519   UROBILINOGEN 0.2 02/20/2010 1403   NITRITE  POSITIVE (A) 01/21/2018 1519   LEUKOCYTESUR NEGATIVE 01/21/2018 1519    Creatinine Clearance: CrCl cannot be calculated (Unknown ideal weight.).  Sepsis Labs: @LABRCNTIP (procalcitonin:4,lacticidven:4) )No results found for this or any previous visit (from the past 240 hour(s)).   Radiological Exams on Admission: Ct Head Wo Contrast  Result Date: 02/14/2018 CLINICAL DATA:  Altered mental status with more confusion than usual. History of dementia. EXAM: CT HEAD WITHOUT CONTRAST TECHNIQUE: Contiguous axial images were obtained from the base of the skull through the vertex without intravenous contrast. COMPARISON:  None. FINDINGS: Brain: Ventricles, cisterns and other CSF spaces are within normal for patient's age as there is minimal age related atrophic change. There is moderate chronic ischemic microvascular disease. There is no mass, mass effect, shift of midline structures or acute hemorrhage. No evidence of acute infarction. Vascular: No hyperdense vessel or unexpected calcification. Skull: Normal. Negative for fracture or focal lesion. Sinuses/Orbits: No acute finding. Other: None. IMPRESSION: No acute findings. Moderate chronic ischemic microvascular disease and mild age related atrophic change. Electronically Signed   By: Elberta Fortis M.D.   On: 02/14/2018 12:30    EKG:   Assessment/Plan Principal Problem:   Hypokalemia Active Problems:   Atrial fibrillation (HCC)   HTN (hypertension)   Memory loss   Elevated troponin   #1.Hypokalemia. Patient looks a little dehydrated. Daughter states she's had decrease in her oral intake over the last several weeks. Potassium level II.7. EKG without acute changes. -Admit to telemetry -Replete -gentle IV fluids -Check magnesium level -Recheck in the morning  #2. Elevated troponin. Initial troponin 0.07. Likely related to demand in the setting of A. Fib with RVR.  -cycle troponin -Continues to trend up consider radiology consult however out  she is a candidate for anything other than medical management -Of note had spoken to daughter about palliative care for a discussion regarding goals of care. She is open to this  #3. Hypertension.air control in the emergency department.home medications include diltiazem. -Continue home meds -Monitor   #4.Atrial fibrillation. This was diagnosed about a month ago.not on anticoagulation due to age and falls. Rate was in the 120s upon presentation. She received vigorous IV fluids at the time of admission heart rate is 90. -Continue diltiazem with parameters -monitor  DVT  prophylaxis: lovenox  Code Status: dnr  Family Communication: daughter at bedside  Disposition Plan: back to facility  Consults called: noneAdmission status: obs    Gwenyth Bender MD [facility]  If 7PM-7AM, please contact night-coverage [email or web address] Password [facility]  02/14/2018, 3:48 PM

## 2018-02-14 NOTE — ED Notes (Signed)
Pt given ativan . Pt was attempting to pull lines off. Pt rested, converted to NSR. HR ranging from 80s-90s. BP stable. Provider paged to be notified

## 2018-02-14 NOTE — ED Notes (Signed)
Regular diet dinner tray ordered 

## 2018-02-14 NOTE — ED Triage Notes (Signed)
Pt in from [facility] via [facility], per report pt was LSN last night, symptoms discovered today at 7am, pt reported to have dementia, pt more confused per baseline, per family request pt sent here for eval, pt Hard of hearing, pt alert to self and states age, pt answers questions appropriately, family at bedside, pt hypotensive upon arrival to ED, pt rcvd 450 mL NS pta sys 120s upon arrival

## 2018-02-14 NOTE — ED Notes (Signed)
Pt HR 140s. Cardizem PO dose given.  Dr. Ophelia CharterYates Paged and made aware. Pt on cardiac moniter.

## 2018-02-14 NOTE — ED Notes (Signed)
PureWick placed on patient. 

## 2018-02-15 DIAGNOSIS — H9190 Unspecified hearing loss, unspecified ear: Secondary | ICD-10-CM | POA: Diagnosis present

## 2018-02-15 DIAGNOSIS — E876 Hypokalemia: Secondary | ICD-10-CM | POA: Diagnosis not present

## 2018-02-15 DIAGNOSIS — S32039A Unspecified fracture of third lumbar vertebra, initial encounter for closed fracture: Secondary | ICD-10-CM | POA: Diagnosis present

## 2018-02-15 DIAGNOSIS — R000 Tachycardia, unspecified: Secondary | ICD-10-CM | POA: Diagnosis present

## 2018-02-15 DIAGNOSIS — B372 Candidiasis of skin and nail: Secondary | ICD-10-CM | POA: Diagnosis present

## 2018-02-15 DIAGNOSIS — M545 Low back pain, unspecified: Secondary | ICD-10-CM | POA: Diagnosis present

## 2018-02-15 DIAGNOSIS — Z9181 History of falling: Secondary | ICD-10-CM | POA: Diagnosis not present

## 2018-02-15 DIAGNOSIS — N390 Urinary tract infection, site not specified: Secondary | ICD-10-CM | POA: Insufficient documentation

## 2018-02-15 DIAGNOSIS — Z85038 Personal history of other malignant neoplasm of large intestine: Secondary | ICD-10-CM | POA: Diagnosis not present

## 2018-02-15 DIAGNOSIS — F0280 Dementia in other diseases classified elsewhere without behavioral disturbance: Secondary | ICD-10-CM | POA: Diagnosis present

## 2018-02-15 DIAGNOSIS — I444 Left anterior fascicular block: Secondary | ICD-10-CM | POA: Diagnosis present

## 2018-02-15 DIAGNOSIS — I10 Essential (primary) hypertension: Secondary | ICD-10-CM | POA: Diagnosis present

## 2018-02-15 DIAGNOSIS — R413 Other amnesia: Secondary | ICD-10-CM | POA: Diagnosis present

## 2018-02-15 DIAGNOSIS — G3183 Dementia with Lewy bodies: Secondary | ICD-10-CM | POA: Diagnosis not present

## 2018-02-15 DIAGNOSIS — M5136 Other intervertebral disc degeneration, lumbar region: Secondary | ICD-10-CM | POA: Diagnosis present

## 2018-02-15 DIAGNOSIS — Z974 Presence of external hearing-aid: Secondary | ICD-10-CM | POA: Diagnosis not present

## 2018-02-15 DIAGNOSIS — W19XXXA Unspecified fall, initial encounter: Secondary | ICD-10-CM | POA: Diagnosis present

## 2018-02-15 DIAGNOSIS — I482 Chronic atrial fibrillation: Secondary | ICD-10-CM | POA: Diagnosis present

## 2018-02-15 DIAGNOSIS — I4891 Unspecified atrial fibrillation: Secondary | ICD-10-CM | POA: Diagnosis present

## 2018-02-15 DIAGNOSIS — Z66 Do not resuscitate: Secondary | ICD-10-CM | POA: Diagnosis present

## 2018-02-15 DIAGNOSIS — Z515 Encounter for palliative care: Secondary | ICD-10-CM | POA: Insufficient documentation

## 2018-02-15 DIAGNOSIS — R748 Abnormal levels of other serum enzymes: Secondary | ICD-10-CM | POA: Diagnosis present

## 2018-02-15 DIAGNOSIS — E860 Dehydration: Secondary | ICD-10-CM | POA: Diagnosis present

## 2018-02-15 LAB — BASIC METABOLIC PANEL
Anion gap: 10 (ref 5–15)
BUN: 23 mg/dL — AB (ref 6–20)
CALCIUM: 7.8 mg/dL — AB (ref 8.9–10.3)
CO2: 22 mmol/L (ref 22–32)
CREATININE: 0.71 mg/dL (ref 0.44–1.00)
Chloride: 102 mmol/L (ref 101–111)
GFR calc non Af Amer: >60 mL/min (ref >60)
GLUCOSE: 81 mg/dL (ref 65–99)
Potassium: 3.3 mmol/L — ABNORMAL LOW (ref 3.5–5.1)
Sodium: 134 mmol/L — ABNORMAL LOW (ref 135–145)

## 2018-02-15 LAB — CBC
HCT: 32.5 % — ABNORMAL LOW (ref 36.0–46.0)
Hemoglobin: 10.8 g/dL — ABNORMAL LOW (ref 12.0–15.0)
MCH: 30.9 pg (ref 26.0–34.0)
MCHC: 33.2 g/dL (ref 30.0–36.0)
MCV: 92.9 fL (ref 78.0–100.0)
PLATELETS: 213 10*3/uL (ref 150–400)
RBC: 3.5 MIL/uL — AB (ref 3.87–5.11)
RDW: 13 % (ref 11.5–15.5)
WBC: 7.9 10*3/uL (ref 4.0–10.5)

## 2018-02-15 LAB — TROPONIN I: Troponin I: 0.07 ng/mL (ref <0.03)

## 2018-02-15 MED ORDER — SODIUM CHLORIDE 0.9 % IV SOLN
1.0000 g | INTRAVENOUS | Status: DC
  Administered 2018-02-15 – 2018-02-16 (×2): 1 g via INTRAVENOUS
  Filled 2018-02-15 (×2): qty 10

## 2018-02-15 MED ORDER — OXYCODONE HCL 5 MG PO TABS: 5.0000 mg | ORAL_TABLET | ORAL | Status: DC | PRN

## 2018-02-15 MED ORDER — ACETAMINOPHEN 325 MG PO TABS
650.0000 mg | ORAL_TABLET | Freq: Three times a day (TID) | ORAL | Status: DC
Start: 2018-02-15 — End: 2018-02-17
  Administered 2018-02-15 – 2018-02-16 (×4): 650 mg via ORAL
  Filled 2018-02-15 (×4): qty 2

## 2018-02-15 MED ORDER — NYSTATIN-TRIAMCINOLONE 100000-0.1 UNIT/GM-% EX CREA
TOPICAL_CREAM | Freq: Two times a day (BID) | CUTANEOUS | Status: DC
  Administered 2018-02-15 – 2018-02-16 (×3): via TOPICAL
  Filled 2018-02-15 (×2): qty 15

## 2018-02-15 MED ORDER — ASPIRIN 81 MG PO CHEW
81.0000 mg | CHEWABLE_TABLET | Freq: Every day | ORAL | Status: DC
  Administered 2018-02-15 – 2018-02-16 (×2): 81 mg via ORAL
  Filled 2018-02-15 (×2): qty 1

## 2018-02-15 MED ORDER — DILTIAZEM HCL ER COATED BEADS 240 MG PO CP24
240.0000 mg | ORAL_CAPSULE | Freq: Every day | ORAL | Status: DC
  Administered 2018-02-15 – 2018-02-16 (×2): 240 mg via ORAL
  Filled 2018-02-15 (×2): qty 1

## 2018-02-15 MED ORDER — POTASSIUM CHLORIDE IN NACL 40-0.9 MEQ/L-% IV SOLN
INTRAVENOUS | Status: DC
  Administered 2018-02-15 – 2018-02-16 (×3): 100 mL/h via INTRAVENOUS
  Filled 2018-02-15 (×3): qty 1000

## 2018-02-15 NOTE — Progress Notes (Signed)
PROGRESS NOTE    Peggy Webb  ZOX:[id number] DOB: 04/07/1914 DOA: 02/14/2018 PCP: Angela Coxasanayaka, Gayani Y, MD   Brief Narrative: Patient is a 84 year old female with past medical history of dementia, hypertension, colon cancer, atrial fibrillation who was brought to the emergency department with complaints of lethargy and confusion.  She was recently hospitalized from 3/31 two 4/4 for new onset A. fib with RVR.  She was followed Rex is not on anti- coagulation due to fall risks.  Patient was also found to be hypokalemic.  Assessment & Plan:   Principal Problem:   Hypokalemia Active Problems:   Atrial fibrillation (HCC)   HTN (hypertension)   Memory loss   Elevated troponin   Lumbar back pain  Altered mental status with underlying dementia: Suspected to be from urinary tract infection.  Started on ceftriaxone.  We will follow-up urine culture Currently she is alert and her mental status is at baseline.  She is hard on hearing.  Palliative care following.  Hypokalemia: Supplemented with potassium.  We will continue to monitor the potassium level.  A. fib with RVR: Heart rate continues to fluctuate.  We will continue to monitor on telemetry.  Will restart her home Cardizem.  Elevated troponin: Most associated  with tachycardia.  Troponin did not trend up significantly.  Hypertension: Currently blood pressure stable.  We will continue home meds.  Back pain: She has old L3 vertebral body compression fracture.  Palliative care requested for IR evaluation for possible kyphoplasty. Family also considering for hospice services at home.  Continue supportive care and pain medications.  Perigenital yeast infection: Started on antifungals.   DVT prophylaxis: Lovenox Code Status: DnR Family Communication: None present at the bedside Disposition Plan: Likely home versus hospice at home in 1 to 2 days   Consultants: Palliative care  Procedures: None  Antimicrobials: Ceftriaxone since  02/15/2018  Subjective: Patient seen and examined at bedside this morning.  Remains comfortable.  Denies any complaints.  She said she wanted to have the lunch.  Heart rate fluctuating on monitor.  Overall looks stable.  Objective: Vitals:   02/15/18 1113 02/15/18 1200 02/15/18 1300 02/15/18 1347  BP: (!) 131/93 110/75 (!) 118/97   Pulse: 94 68 (!) 48   Resp: 20 (!) 21 (!) 24   Temp:      TempSrc:      SpO2: 96% 98% 96%   Weight:    71.3 kg (157 lb 3 oz)  Height:    5\' 6"  (1.676 m)    Intake/Output Summary (Last 24 hours) at 02/15/2018 1400 Last data filed at 02/14/2018 2027 Gross per 24 hour  Intake 406.25 ml  Output -  Net 406.25 ml   Filed Weights   02/15/18 1347  Weight: 71.3 kg (157 lb 3 oz)    Examination:  General exam: Appears calm and comfortable ,Not in distress,average built, elderly female HEENT:PERRL,Oral mucosa moist, Ear/Nose normal on gross exam Respiratory system: Bilateral equal air entry, normal vesicular breath sounds, no wheezes or crackles  Cardiovascular system: A. fib with RVR, no JVD, murmurs, rubs, gallops or clicks. No pedal edema. Gastrointestinal system: Abdomen is nondistended, soft and nontender. No organomegaly or masses felt. Normal bowel sounds heard. Central nervous system: Alert and but not oriented. No focal neurological deficits. Extremities: No edema, no clubbing ,no cyanosis, distal peripheral pulses palpable. Skin: No rashes, lesions or ulcers,no icterus ,no pallor   Data Reviewed: I have personally reviewed following labs and imaging studies  CBC: Recent Labs  Lab 02/14/18 1127 02/15/18 0300  WBC 13.3* 7.9  HGB 12.9 10.8*  HCT 38.4 32.5*  MCV 91.9 92.9  PLT 231 213   Basic Metabolic Panel: Recent Labs  Lab 02/14/18 1127 02/15/18 0300  NA 132* 134*  K 2.7* 3.3*  CL 93* 102  CO2 26 22  GLUCOSE 131* 81  BUN 23* 23*  CREATININE 0.94 0.71  CALCIUM 8.5* 7.8*  MG 1.8  --    GFR: Estimated Creatinine Clearance: 35.9  mL/min (by C-G formula based on SCr of 0.71 mg/dL). Liver Function Tests: Recent Labs  Lab 02/14/18 1127  AST 25  ALT 16  ALKPHOS 94  BILITOT 0.8  PROT 5.6*  ALBUMIN 2.9*   No results for input(s): LIPASE, AMYLASE in the last 168 hours. No results for input(s): AMMONIA in the last 168 hours. Coagulation Profile: No results for input(s): INR, PROTIME in the last 168 hours. Cardiac Enzymes: Recent Labs  Lab 02/14/18 1127 02/15/18 0025  TROPONINI 0.07* 0.07*   BNP (last 3 results) No results for input(s): PROBNP in the last 8760 hours. HbA1C: No results for input(s): HGBA1C in the last 72 hours. CBG: Recent Labs  Lab 02/14/18 1123 02/14/18 1918  GLUCAP 126* 96   Lipid Profile: No results for input(s): CHOL, HDL, LDLCALC, TRIG, CHOLHDL, LDLDIRECT in the last 72 hours. Thyroid Function Tests: No results for input(s): TSH, T4TOTAL, FREET4, T3FREE, THYROIDAB in the last 72 hours. Anemia Panel: No results for input(s): VITAMINB12, FOLATE, FERRITIN, TIBC, IRON, RETICCTPCT in the last 72 hours. Sepsis Labs: No results for input(s): PROCALCITON, LATICACIDVEN in the last 168 hours.  No results found for this or any previous visit (from the past 240 hour(s)).       Radiology Studies: Ct Head Wo Contrast  Result Date: 02/14/2018 CLINICAL DATA:  Altered mental status with more confusion than usual. History of dementia. EXAM: CT HEAD WITHOUT CONTRAST TECHNIQUE: Contiguous axial images were obtained from the base of the skull through the vertex without intravenous contrast. COMPARISON:  None. FINDINGS: Brain: Ventricles, cisterns and other CSF spaces are within normal for patient's age as there is minimal age related atrophic change. There is moderate chronic ischemic microvascular disease. There is no mass, mass effect, shift of midline structures or acute hemorrhage. No evidence of acute infarction. Vascular: No hyperdense vessel or unexpected calcification. Skull: Normal.  Negative for fracture or focal lesion. Sinuses/Orbits: No acute finding. Other: None. IMPRESSION: No acute findings. Moderate chronic ischemic microvascular disease and mild age related atrophic change. Electronically Signed   By: Elberta Fortis M.D.   On: 02/14/2018 12:30        Scheduled Meds: . acetaminophen  650 mg Oral Q8H  . enoxaparin (LOVENOX) injection  40 mg Subcutaneous Q24H  . nystatin-triamcinolone   Topical BID  . potassium chloride SA  20 mEq Oral BID   Continuous Infusions: . cefTRIAXone (ROCEPHIN)  IV    . sodium chloride 0.9 % 1,000 mL with potassium chloride 40 mEq infusion 100 mL/hr at 02/15/18 1116     LOS: 0 days    Time spent: More than 50% of that time was spent in counseling and/or coordination of care.      Burnadette Pop, MD [facility] Pager [phone]  If 7PM-7AM, please contact night-coverage [email or web address] Password [email or web address] 02/15/2018, 2:01 PM

## 2018-02-15 NOTE — ED Notes (Signed)
Regular Diet has been ordered for Lunch. 

## 2018-02-15 NOTE — Progress Notes (Signed)
Peggy Webb is a [age] y.o. female patient admitted from ED awake, alert - oriented  X 4 - no acute distress noted.  VSS - Blood pressure (!) 149/98, pulse (!) 152, temperature 99.5 F (37.5 C), temperature source Oral, resp. rate (!) 21, height 5\' 6"  (1.676 m), weight 71.3 kg (157 lb 3 oz), SpO2 94 %.    IV in place, occlusive dsg intact without redness.  Orientation to room, and floor completed with information packet given to patient/family.  Patient declined safety video at this time.  Admission INP armband ID verified with patient/family, and in place.   SR up x 2, fall assessment complete, with patient and family able to verbalize understanding of risk associated with falls, and verbalized understanding to call nsg before up out of bed.  Call light within reach, patient able to voice, and demonstrate understanding.  Skin, clean-dry- intact without evidence of bruising, or skin tears.   No evidence of skin break down noted on exam.     Will cont to eval and treat per MD orders.  Marca AnconaLaura M [name], RN 02/15/2018 2:05 PM

## 2018-02-15 NOTE — ED Notes (Signed)
Pt ate minimal breakfast but styates she can eat no more.

## 2018-02-15 NOTE — Progress Notes (Signed)
Patient ID: Rosalee KaufmanMargaret W Lakeman, female   DOB: 07/18/1914, [age] y.o.   MRN: [id number]   Request received for L3 VP/KP  Called to discuss with Norvel RichardsMarianne Dellinger PA  3/31 Xray: IMPRESSION: Probable old L3 vertebral body fracture is noted. Multilevel degenerative disc disease. No acute abnormality seen in the lumbar Spine.  New UTI- being treated  IR would require UTI to be treated fully and UA clear Also would need L  Spine MRI w/o Cx to determine acuity of fracture  If no edema on MRI- this fx would be deemed to "old" for this procedure  Can be done as OP-- as most are performed as OP Does not require admission  Clerance LavMarianne will let us know

## 2018-02-15 NOTE — ED Notes (Signed)
Atetmpted to call report

## 2018-02-15 NOTE — Progress Notes (Signed)
MD paged about patient's heart rate jumping to 160 but not sustaining. Mostly in 150's but now is 142.

## 2018-02-15 NOTE — Consult Note (Addendum)
Consultation Note Date: 02/15/2018   Patient Name: Peggy Webb  DOB: 09/13/1914  MRN: [id number]  Age / Sex: [id number] Webb.o., female  PCP: Peggy Coxasanayaka, Gayani Y, MD Referring Physician: Burnadette PopAdhikari, Amrit, MD  Reason for Consultation: Establishing goals of care  HPI/Patient Profile: [id number] Webb.o. female  with past medical history of dementia, colon cancer, atrial fibrillation who was admitted on 02/14/2018 with lethargy and confusion.  She was found to have a UTI as well as afib with RVR.  She had a recent admission in March for the same.  Clinical Assessment and Goals of Care:  I have reviewed medical records including EPIC notes, labs and imaging, received report from the bedside RN assessed the patient and then spoke on the phone with her daughter Peggy Webb to discuss diagnosis prognosis, GOC and options.  I introduced Palliative Medicine as specialized medical care for people living with serious illness. It focuses on providing relief from the symptoms and stress of a serious illness. The goal is to improve quality of life for both the patient and the family.  We discussed a brief life review of the patient. Per her daughter she is a generous, kind, [employer].  She is always in good spirits. Her daughters moved her from her home 13 years ago to live with Peggy PoundDeborah and her husband.  She used to be very active in church but since moving to [place] has not been.  As far as functional and nutritional status - she was walking with a walker 3 weeks ago when she had a fall that seemed result in severe back pain. Per her daughter the patient "Yelped" in pain.  That is when she initially came to the hospital.  [facility] was [facility] and has not done well there at all.  She has been unable to walk again, she has virtually stopped eating, and her dementia has significantly worsened.  Per Peggy Poundeborah her afib has  acted up as well.    She had a UTI 1 month ago that was treated with Cipro.  Peggy PoundDeborah feels the UTI never cleared.  Further her back pain has remained.  Peggy Webb and I discussed her L3 vertebral fracture and DDD.  Peggy PoundDeborah also complained of bright red irritation in her mother's genitalia area.  I attempted to elicit values and goals of care important to the patient.  She wants to be and home and her daughter wants to take her home.  Unfortunately her daughter has DDD as well and can not lift her mother.  She would need to hire in home help.  The difference between aggressive medical intervention and comfort care was considered in light of the patient's goals of care.  The family is focused on their mother's comfort, but they would like to treat the treatable within reason.  Hospice and Palliative Care services outpatient were explained.  If Peggy Webb qualifies for hospice services in the home - her daughter would like to accept them and ideally bring Peggy Webb back to her house.  Questions and concerns were addressed.  The family was encouraged to call with questions or concerns.    Primary Decision Maker:  NEXT OF KIN daughter Peggy Webb    SUMMARY OF RECOMMENDATIONS     Will start Rocephin for UTI as it seems Cipro was ineffective last time.  Culture pending  Will start nystatin cream for yeast in her genital area per daughter.  Will discuss possible evaluation for kyphoplasty to relieve severe back pain.  PMT will need to re-evaluation later during this hospitalization to see if the patient is a candidate for hospice services at home.  Will request patient be weighed - daughter wanted to determine if she has lost weight.  Family is focused on her comfort and Quality over Quantity.  Code Status/Advance Care Planning:  DNR    Symptom Management:   Heating pad to back  Tylenol scheduled for pain  Oxy PRN for pain not relieved by tylenol  Palliative Prophylaxis:    Delirium Protocol  Psycho-social/Spiritual:   Desire for further Chaplaincy support: yes  Prognosis:  Due to extremely advanced age, recent marked functional decline (lost the ability to walk in less than one month), newly decreased appetite, poor nutritional status AEB serum albumin, recurrent/refractory UTI, 2 admissions in a month, new onset afib with RVR that is both refractory to treatment and not anticogulated, dementia, and recent fall, her life expectancy is 6 months or less.    Discharge Planning: To Be Determined SNF with Palliative vs Home with Hospice      Primary Diagnoses: Present on Admission: . Atrial fibrillation (HCC) . Hypokalemia . HTN (hypertension) . Memory loss . Elevated troponin   I have reviewed the medical record, interviewed the patient and family, and examined the patient. The following aspects are pertinent.  Past Medical History:  Diagnosis Date  . A-fib (HCC)   . Elevated troponin   . History of colon cancer   . HTN (hypertension)   . Hypokalemia   . Memory loss    Social History   Socioeconomic History  . Marital status: Widowed    Spouse name: Not on file  . Number of children: Not on file  . Years of education: Not on file  . Highest education level: Not on file  Occupational History  . Not on file  Social Needs  . Financial resource strain: Not on file  . Food insecurity:    Worry: Not on file    Inability: Not on file  . Transportation needs:    Medical: Not on file    Non-medical: Not on file  Tobacco Use  . Smoking status: Never Smoker  . Smokeless tobacco: Never Used  Substance and Sexual Activity  . Alcohol use: Never    Frequency: Never  . Drug use: Never  . Sexual activity: Not Currently  Lifestyle  . Physical activity:    Days per week: Not on file    Minutes per session: Not on file  . Stress: Not on file  Relationships  . Social connections:    Talks on phone: Not on file    Gets together: Not on  file    Attends religious service: Not on file    Active member of club or organization: Not on file    Attends meetings of clubs or organizations: Not on file    Relationship status: Not on file  Other Topics Concern  . Not on file  Social History Narrative  . Not on file  No family history on file. Scheduled Meds: . enoxaparin (LOVENOX) injection  40 mg Subcutaneous Q24H  . potassium chloride SA  20 mEq Oral BID   Continuous Infusions: . sodium chloride 0.9 % 1,000 mL with potassium chloride 40 mEq infusion 100 mL/hr at 02/15/18 1116   PRN Meds:.acetaminophen **OR** acetaminophen, HYDROcodone-acetaminophen, ondansetron **OR** ondansetron (ZOFRAN) IV, senna-docusate Allergies  Allergen Reactions  . Shrimp [Shellfish Allergy] Nausea And Vomiting   Review of Systems patient HOH and pleasantly demented  Physical Exam  Awake very hard of hearing, very pleasant CV irreg resp no distress Abdomen soft, nt, nd  Vital Signs: BP 110/75   Pulse 68   Temp 98.8 F (37.1 C) (Oral)   Resp (!) 21   SpO2 98%  Pain Scale: PAINAD   Pain Score: 0-No pain   SpO2: SpO2: 98 % O2 Device:SpO2: 98 % O2 Flow Rate: .   IO: Intake/output summary:   Intake/Output Summary (Last 24 hours) at 02/15/2018 1241 Last data filed at 02/14/2018 2027 Gross per 24 hour  Intake 406.25 ml  Output -  Net 406.25 ml    LBM:   Baseline Weight:   Most recent weight:       Palliative Assessment/Data: 30%     Time In: 10:00 Time Out: 11:10 Time Total: 70 min. Greater than 50%  of this time was spent counseling and coordinating care related to the above assessment and plan.  Signed by: Norvel Richards, PA-C Palliative Medicine Pager: [phone]  Please contact Palliative Medicine Team phone at [phone] for questions and concerns.  For individual provider: See Loretha Stapler

## 2018-02-16 DIAGNOSIS — G3183 Dementia with Lewy bodies: Secondary | ICD-10-CM

## 2018-02-16 DIAGNOSIS — F0280 Dementia in other diseases classified elsewhere without behavioral disturbance: Secondary | ICD-10-CM

## 2018-02-16 LAB — URINE CULTURE

## 2018-02-16 LAB — BASIC METABOLIC PANEL
ANION GAP: 9 (ref 5–15)
BUN: 17 mg/dL (ref 6–20)
CALCIUM: 8.3 mg/dL — AB (ref 8.9–10.3)
CO2: 20 mmol/L — AB (ref 22–32)
Chloride: 106 mmol/L (ref 101–111)
Creatinine, Ser: 0.6 mg/dL (ref 0.44–1.00)
GFR calc non Af Amer: >60 mL/min (ref >60)
GLUCOSE: 134 mg/dL — AB (ref 65–99)
Potassium: 4.6 mmol/L (ref 3.5–5.1)
Sodium: 135 mmol/L (ref 135–145)

## 2018-02-16 MED ORDER — NYSTATIN-TRIAMCINOLONE 100000-0.1 UNIT/GM-% EX CREA: TOPICAL_CREAM | Freq: Two times a day (BID) | CUTANEOUS | 0 refills | Status: AC

## 2018-02-16 MED ORDER — CEFDINIR 300 MG PO CAPS: 300.0000 mg | ORAL_CAPSULE | Freq: Two times a day (BID) | ORAL | 0 refills | Status: AC

## 2018-02-16 NOTE — Discharge Summary (Signed)
Physician Discharge Summary  Peggy Webb:[id number] DOB: 1914-03-29 DOA: 02/14/2018  PCP: Angela Cox, MD  Admit date: 02/14/2018 Discharge date: 02/16/2018  Admitted From: Home Disposition:  Home  Discharge Condition:Stable CODE STATUS:FULL, DNR, Comfort Care Diet recommendation: Heart Healthy   Brief/Interim Summary:  Patient is a 84 year old female with past medical history of dementia, hypertension, colon cancer, atrial fibrillation who was brought to the emergency department with complaints of lethargy and confusion.  She was recently hospitalized from 3/31 two 4/4 for new onset A. fib with RVR.  She was followed [facility] is not on anti- coagulation due to fall risks.  Patient was also found to be hypokalemic. Patient admitted for further management.  Her UA was suggestive of urinary tract infection, so started  on antibiotics. Urine culture showed multiple organisms. Patient was also found to be in A. fib with RVR on presentation which has improved today after we  restarted her home Cardizem.  Patient was also followed by palliative care here.  She is stable for discharge to skilled nursing facility today.  Palliative care will follow her over there.  Following problems were addressed during hospitalization:  Altered mental status with underlying dementia: Suspected to be from urinary tract infection.  Started on ceftriaxone.  Urine culture grew mixed organisms. Currently she is alert and her mental status is at baseline.    Hypokalemia: Supplemented with potassium.    Continue potassium supplementation.  A. fib with RVR: Currently stable.  Will restart her home Cardizem.  Not on anticoagulation for risks of falls.  Elevated troponin: Most   likely associated  with tachycardia.  Troponin did not trend up significantly.  Patient denies any chest pain.  Hypertension: Currently blood pressure stable.  We will continue home meds.  Back pain: She has old L3 vertebral  body compression fracture.  Palliative care requested for IR evaluation for possible kyphoplasty.  But today patient does not complain of any back pain.  She was also suspected to have urinary tract infection so IR will evaluate her as an outpatient if necessary. Family will also considering for hospice services at home.  Palliative care will follow her at skilled nursing facility.  Perigenital yeast infection: Started on topical antifungals.    Discharge Diagnoses:  Principal Problem:   Hypokalemia Active Problems:   Atrial fibrillation (HCC)   HTN (hypertension)   Memory loss   Elevated troponin   Lumbar back pain   A-fib (HCC)   Lewy body dementia without behavioral disturbance    Discharge Instructions  Discharge Instructions    Diet - low sodium heart healthy   Complete by:  As directed    Discharge instructions   Complete by:  As directed    1) Please follow up with your PCP as an outpatient. 2) Take prescribed medications as instructed. 3)Palliative care will follow as an outpatient at the skilled nursing facility. 4) Repeat U/A and culture at SNF in 1 week .  If her UTI clears,consider moving forward with an evaluation for Kyphoplasty as an outpatient for the lumbar compression fracture.   Increase activity slowly   Complete by:  As directed      Allergies as of 02/16/2018      Reactions   Shrimp [shellfish Allergy] Nausea And Vomiting      Medication List    TAKE these medications   acetaminophen 325 MG tablet Commonly known as:  TYLENOL Take 650 mg by mouth every 6 (six) hours as needed for  mild pain. X 14 days start date 02-09-18 end date 02-23-18   aspirin 81 MG chewable tablet Chew 1 tablet (81 mg total) by mouth daily.   cefdinir 300 MG capsule Commonly known as:  OMNICEF Take 1 capsule (300 mg total) by mouth 2 (two) times daily for 4 days.   diltiazem 240 MG 24 hr capsule Commonly known as:  CARDIZEM CD Take 1 capsule (240 mg total) by mouth  daily.   NONFORMULARY OR COMPOUNDED ITEM Apply 1 application topically every 12 (twelve) hours. "TAC 0.1%, ZINC, NYSTATIN ointment 1:1:1 - apply to peri area every shift for MASD   nystatin-triamcinolone cream Commonly known as:  MYCOLOG II Apply topically 2 (two) times daily.   potassium chloride SA 20 MEQ tablet Commonly known as:  K-DUR,KLOR-CON Take 20 mEq by mouth 2 (two) times daily.       Allergies  Allergen Reactions  . Shrimp [Shellfish Allergy] Nausea And Vomiting    Consultations:  Palliative care   Procedures/Studies: Dg Chest 2 View  Result Date: 01/21/2018 CLINICAL DATA:  Shortness of breath. EXAM: CHEST - 2 VIEW COMPARISON:  Radiograph of February 20, 2010. FINDINGS: Stable cardiomegaly. Atherosclerosis of thoracic aorta is noted. Stable calcified left hilar lymph node. No pneumothorax or pleural effusion is noted. No acute pulmonary disease is noted. Bony thorax is unremarkable. IMPRESSION: No active cardiopulmonary disease. Aortic Atherosclerosis (ICD10-I70.0). Electronically Signed   By: Lupita Raider, M.D.   On: 01/21/2018 17:06   Dg Lumbar Spine Complete  Result Date: 01/21/2018 CLINICAL DATA:  Acute low back pain after fall 2 days ago. EXAM: LUMBAR SPINE - COMPLETE 4+ VIEW COMPARISON:  CT scan of October 11, 2006. FINDINGS: Diffuse osteopenia is noted. No spondylolisthesis is noted. Moderate superior endplate depression of L3 vertebral body is noted most consistent with old fracture. Moderate degenerative disc disease is noted at L4-5 and L5-S1. Degenerative changes seen involving posterior facet joints of L5-S1. IMPRESSION: Probable old L3 vertebral body fracture is noted. Multilevel degenerative disc disease. No acute abnormality seen in the lumbar spine. Electronically Signed   By: Lupita Raider, M.D.   On: 01/21/2018 14:42   Ct Head Wo Contrast  Result Date: 02/14/2018 CLINICAL DATA:  Altered mental status with more confusion than usual. History of  dementia. EXAM: CT HEAD WITHOUT CONTRAST TECHNIQUE: Contiguous axial images were obtained from the base of the skull through the vertex without intravenous contrast. COMPARISON:  None. FINDINGS: Brain: Ventricles, cisterns and other CSF spaces are within normal for patient's age as there is minimal age related atrophic change. There is moderate chronic ischemic microvascular disease. There is no mass, mass effect, shift of midline structures or acute hemorrhage. No evidence of acute infarction. Vascular: No hyperdense vessel or unexpected calcification. Skull: Normal. Negative for fracture or focal lesion. Sinuses/Orbits: No acute finding. Other: None. IMPRESSION: No acute findings. Moderate chronic ischemic microvascular disease and mild age related atrophic change. Electronically Signed   By: Elberta Fortis M.D.   On: 02/14/2018 12:30       Subjective:   Discharge Exam: Vitals:   02/15/18 2117 02/16/18 0600  BP: 140/86 114/65  Pulse: 94 76  Resp: 18 19  Temp: 98.3 F (36.8 C) 98 F (36.7 C)  SpO2: 96% 91%   Vitals:   02/15/18 1400 02/15/18 1600 02/15/18 2117 02/16/18 0600  BP: (!) 149/98 (!) 124/97 140/86 114/65  Pulse: (!) 152 (!) 155 94 76  Resp: (!) 21 18 18 19   Temp: 99.5  F (37.5 C) 97.9 F (36.6 C) 98.3 F (36.8 C) 98 F (36.7 C)  TempSrc: Oral Oral Oral Oral  SpO2: 94% 94% 96% 91%  Weight:      Height:        General: Pt is alert, awake, not in acute distress, elderly female Cardiovascular: A. fib, S1/S2 +, no rubs, no gallops Respiratory: CTA bilaterally, no wheezing, no rhonchi Abdominal: Soft, NT, ND, bowel sounds + Extremities: no edema, no cyanosis    The results of significant diagnostics from this hospitalization (including imaging, microbiology, ancillary and laboratory) are listed below for reference.     Microbiology: Recent Results (from the past 240 hour(s))  Urine Culture     Status: Abnormal   Collection Time: 02/14/18 11:23 AM  Result Value  Ref Range Status   Specimen Description URINE, RANDOM  Final   Special Requests   Final    NONE Performed at [facility], [address], [place], [place] [address]    Culture MULTIPLE SPECIES PRESENT, SUGGEST RECOLLECTION (A)  Final   Report Status 02/16/2018 FINAL  Final     Labs: BNP (last 3 results) No results for input(s): BNP in the last 8760 hours. Basic Metabolic Panel: Recent Labs  Lab 02/14/18 1127 02/15/18 0300 02/16/18 0625  NA 132* 134* 135  K 2.7* 3.3* 4.6  CL 93* 102 106  CO2 26 22 20*  GLUCOSE 131* 81 134*  BUN 23* 23* 17  CREATININE 0.94 0.71 0.60  CALCIUM 8.5* 7.8* 8.3*  MG 1.8  --   --    Liver Function Tests: Recent Labs  Lab 02/14/18 1127  AST 25  ALT 16  ALKPHOS 94  BILITOT 0.8  PROT 5.6*  ALBUMIN 2.9*   No results for input(s): LIPASE, AMYLASE in the last 168 hours. No results for input(s): AMMONIA in the last 168 hours. CBC: Recent Labs  Lab 02/14/18 1127 02/15/18 0300  WBC 13.3* 7.9  HGB 12.9 10.8*  HCT 38.4 32.5*  MCV 91.9 92.9  PLT 231 213   Cardiac Enzymes: Recent Labs  Lab 02/14/18 1127 02/15/18 0025  TROPONINI 0.07* 0.07*   BNP: Invalid input(s): POCBNP CBG: Recent Labs  Lab 02/14/18 1123 02/14/18 1918  GLUCAP 126* 96   D-Dimer No results for input(s): DDIMER in the last 72 hours. Hgb A1c No results for input(s): HGBA1C in the last 72 hours. Lipid Profile No results for input(s): CHOL, HDL, LDLCALC, TRIG, CHOLHDL, LDLDIRECT in the last 72 hours. Thyroid function studies No results for input(s): TSH, T4TOTAL, T3FREE, THYROIDAB in the last 72 hours.  Invalid input(s): FREET3 Anemia work up No results for input(s): VITAMINB12, FOLATE, FERRITIN, TIBC, IRON, RETICCTPCT in the last 72 hours. Urinalysis    Component Value Date/Time   COLORURINE YELLOW 02/14/2018 1725   APPEARANCEUR CLOUDY (A) 02/14/2018 1725   LABSPEC 1.010 02/14/2018 1725   PHURINE 6.0 02/14/2018 1725   GLUCOSEU NEGATIVE 02/14/2018  1725   HGBUR MODERATE (A) 02/14/2018 1725   BILIRUBINUR NEGATIVE 02/14/2018 1725   KETONESUR NEGATIVE 02/14/2018 1725   PROTEINUR NEGATIVE 02/14/2018 1725   UROBILINOGEN 0.2 02/20/2010 1403   NITRITE POSITIVE (A) 02/14/2018 1725   LEUKOCYTESUR SMALL (A) 02/14/2018 1725   Sepsis Labs Invalid input(s): PROCALCITONIN,  WBC,  LACTICIDVEN Microbiology Recent Results (from the past 240 hour(s))  Urine Culture     Status: Abnormal   Collection Time: 02/14/18 11:23 AM  Result Value Ref Range Status   Specimen Description URINE, RANDOM  Final  Special Requests   Final    NONE Performed at [facility], [address] [place], [place] [address]    Culture MULTIPLE SPECIES PRESENT, SUGGEST RECOLLECTION (A)  Final   Report Status 02/16/2018 FINAL  Final     Time coordinating discharge: 35 minutes  SIGNED:   Burnadette PopAmrit Martisha Toulouse, MD  [facility] 02/16/2018, 12:34 PM Pager [phone]  If 7PM-7AM, please contact night-coverage [email or web address] Password [email or web address]

## 2018-02-16 NOTE — Care Management Note (Addendum)
Case Management Note  Patient Details  Name: Peggy Webb MRN: [id number] Date of Birth: 03/19/1914  Subjective/Objective:   Admitted with AMS,hypokalemia,UTI, hx of dementia, hypertension, colon cancer, atrial fibrillation. Recently hospitalized from 3/31 two 4/4 for new onset A. fib with RVR. From SNF/[facility]. NCM spoke with daughter Peggy Webb regarding discharge planning. Peggy Webb states  Family is leaning towards mom being d/c with hospice Care.  NCM informed Peggy Webb of choice list for home hospice, [place]. NCM to place choice list @  pt 's bedside for Peggy Webb.  Peggy Webb (Daughter) Peggy Webb (Daughter)     [phone]     PCP: Karlene EinsteinGayani Dasanayaka      Action/Plan: SNF vs home with hospice care... palliative following .... NCM following for disposition needs.  Expected Discharge Date:                  Expected Discharge Plan:     In-House Referral:  Clinical Social Work  Discharge planning Services  CM Consult  Post Acute Care Choice:    Choice offered to:     DME Arranged:    DME Agency:     HH Arranged:    HH Agency:     Status of Service:  In process, will continue to follow  If discussed at Long Length of Stay Meetings, dates discussed:    Additional Comments:  Epifanio LeschesCole, Ancel Easler Hudson, RN 02/16/2018, 11:16 AM

## 2018-02-16 NOTE — Clinical Social Work Note (Signed)
Clinical Social Work Assessment  Patient Details  Name: Peggy Webb MRN: [id number] Date of Birth: 11/10/1913  Date of referral:  02/16/18               Reason for consult:  Discharge Planning                Permission sought to share information with:  Facility Medical sales representativeContact Representative, Family Supports Permission granted to share information::  No  Name::     Water quality scientistDeborah  Agency::  [facility]  Relationship::  Daughter  Contact Information:     Housing/Transportation Living arrangements for the past 2 months:  Skilled Building surveyorursing Facility Source of Information:  Adult Children Patient Interpreter Needed:  None Criminal Activity/Legal Involvement Pertinent to Current Situation/Hospitalization:  No - Comment as needed Significant Relationships:  Adult Children Lives with:  Adult Children, Facility Resident Do you feel safe going back to the place where you live?  Yes Need for family participation in patient care:  Yes (Comment)  Care giving concerns:  CSW received consult for possible SNF placement at time of discharge. CSW spoke with patient's daughter regarding return to SNF. She would like for patient to [facility], without hospice for now. CSW to continue to follow and assist with discharge planning needs.   Social Worker assessment / plan:  CSW spoke with patient's daughter concerning return to SNF.   Employment status:  Retired Health and safety inspectornsurance information:  Medicare PT Recommendations:  Skilled Nursing Facility Information / Referral to community resources:  Skilled Nursing Facility  Patient/Family's Response to care:  Patient's daughter reports understanding of discharge plan.   Patient/Family's Understanding of and Emotional Response to Diagnosis, Current Treatment, and Prognosis:  Patient/family is realistic regarding therapy needs and expressed being hopeful for return to SNF placement. They will consider hospice in the future.  Patient's expressed understanding  of CSW role and discharge process as well as medical condition. No questions/concerns about plan or treatment.    Emotional Assessment Appearance:  Appears stated age Attitude/Demeanor/Rapport:  Unable to Assess Affect (typically observed):  Unable to Assess Orientation:  Oriented to Self Alcohol / Substance use:  Not Applicable Psych involvement (Current and /or in the community):  No (Comment)  Discharge Needs  Concerns to be addressed:  Care Coordination Readmission within the last 30 days:  No Current discharge risk:  Physical Impairment Barriers to Discharge:  No Barriers Identified   Mearl Latinadia S Ethan Clayburn, LCSWA 02/16/2018, 12:32 PM

## 2018-02-16 NOTE — Progress Notes (Signed)
I examined the patient at bedside.  She is very hard of hearing (even with her hearing aid in place).  She appears to be feeling well today.  She is pleasant (as always) and reports no pain.  Her pulse rate this morning was 76.    I called her daughter Chriss CzarDeborah Stanton [phone] to determine if she wants her mother to discharge home with hospice.  I spoke with Deborah's husband Karren BurlyDwight on the phone.  The family's preference is that their [facility].  They would [facility].   The family asks that a U/A be done in a week as IR would need a clean U/A before considering kyphoplasty.   Karren BurlyDwight has arranged for Claris CheMargaret to have [facility].  Karren Burlywight and Gavin PoundDeborah will re-consider taking Claris CheMargaret home with hospice in the next couple of weeks.  Recommendations:  1.  [facility] 2.  Palliative Care to follow outpatient 3.  Urine Culture results indicate multiple species (again!).  4.  Patient has been treated with Rocephin and Fosfomycin (during her 3/31 admission).  She was also treated with Cipro at SNF.  Unfortunately she has continued to suffer with UTI. 5.  Repeat U/A and culture at SNF in 1 week 5.  If her UTI clears Karren Burlywight and Gavin PoundDeborah will consider moving forward with an evaluation for Kyphoplasty as an outpatient.    Norvel RichardsMarianne Jecenia Leamer, PA-C Palliative Medicine Pager: [phone]  35 min.

## 2018-02-16 NOTE — Progress Notes (Signed)
Patient will DC to: [facility]  Anticipated DC date: 02/16/18 Family notified: Daughter Transport by: Sharin MonsPTAR (behind)   Per MD patient ready for [facility]. RN, patient, patient's family, and facility notified of DC. Discharge Summary sent to facility. RN given number for report [phone] Room 502A). DC packet on chart. Ambulance transport requested for patient.   CSW signing off.  Cristobal GoldmannNadia Tassie Pollett, LCSW Clinical Social Worker [phone]

## 2018-02-16 NOTE — Progress Notes (Signed)
Patient discharge teaching given to patient and family, including activity, diet, follow-up appoints, and medications. Patient and family verbalized understanding of all discharge instructions. IV access was d/c'd. Vitals are stable. Skin is intact except as charted in most recent assessments. Pt to be escorted out by PTAR to be [facility].  Report called to [employer], Charity fundraiserN at [facility].

## 2018-02-16 NOTE — [place] FL2 (Signed)
[place] MEDICAID FL2 LEVEL OF CARE SCREENING TOOL     IDENTIFICATION  Patient Name: Peggy Webb Birthdate: 01/23/1914 Sex: female Admission Date (Current Location): 02/14/2018  [facility] and [place] Number:  Producer, television/film/video and Address:  The . [facility], [address], [place], [place] [address]      Provider Number: [id number]  Attending Physician Name and Address:  Burnadette Pop, MD  Relative Name and Phone Number:       Current Level of Care: Hospital Recommended Level of Care: Skilled Nursing Facility Prior Approval Number:    Date Approved/Denied:   PASRR Number: [id number]  Discharge Plan: SNF    Current Diagnoses: Patient Active Problem List   Diagnosis Date Noted  . Lewy body dementia without behavioral disturbance   . A-fib (HCC) 02/15/2018  . Lumbar back pain   . Urinary tract infection without hematuria   . Palliative care encounter   . Hypokalemia 02/14/2018  . Elevated troponin 02/14/2018  . HTN (hypertension)   . Memory loss   . Atrial fibrillation (HCC) 01/21/2018    Orientation RESPIRATION BLADDER Height & Weight     Self  Normal Incontinent, External catheter Weight: 71.3 kg (157 lb 3 oz) Height:  5\' 6"  (167.6 cm)  BEHAVIORAL SYMPTOMS/MOOD NEUROLOGICAL BOWEL NUTRITION STATUS      Incontinent Diet(Please see DC Summary)  AMBULATORY STATUS COMMUNICATION OF NEEDS Skin   Extensive Assist Verbally Normal                       Personal Care Assistance Level of Assistance  Bathing, Feeding, Dressing Bathing Assistance: Maximum assistance Feeding assistance: Limited assistance Dressing Assistance: Maximum assistance     Functional Limitations Info  Sight, Hearing, Speech Sight Info: Adequate Hearing Info: Impaired Speech Info: Adequate    SPECIAL CARE FACTORS FREQUENCY  PT (By licensed PT), OT (By licensed OT)     PT Frequency: 5x/week OT Frequency: 3x/week            Contractures       Additional Factors Info  Code Status, Allergies Code Status Info: DNR Allergies Info:  Shrimp Shellfish Allergy           Current Medications (02/16/2018):  This is the current hospital active medication list Current Facility-Administered Medications  Medication Dose Route Frequency Provider Last Rate Last Dose  . 0.9 % NaCl with KCl 40 mEq / L  infusion   Intravenous Continuous Burnadette Pop, MD 100 mL/hr at 02/16/18 0615 100 mL/hr at 02/16/18 0615  . acetaminophen (TYLENOL) tablet 650 mg  650 mg Oral Q6H PRN Gwenyth Bender, NP       Or  . acetaminophen (TYLENOL) suppository 650 mg  650 mg Rectal Q6H PRN Gwenyth Bender, NP      . acetaminophen (TYLENOL) tablet 650 mg  650 mg Oral Q8H Dellinger, Marianne L, PA-C   650 mg at 02/16/18 0523  . aspirin chewable tablet 81 mg  81 mg Oral Daily Burnadette Pop, MD   81 mg at 02/16/18 0950  . cefTRIAXone (ROCEPHIN) 1 g in sodium chloride 0.9 % 100 mL IVPB  1 g Intravenous Q24H Dellinger, Marianne L, PA-C   Stopped at 02/15/18 1707  . diltiazem (CARDIZEM CD) 24 hr capsule 240 mg  240 mg Oral Daily Burnadette Pop, MD   240 mg at 02/16/18 0950  . enoxaparin (LOVENOX) injection 40 mg  40 mg Subcutaneous Q24H Black, Lesle Chris, NP  40 mg at 02/15/18 1510  . HYDROcodone-acetaminophen (NORCO/VICODIN) 5-325 MG per tablet 1-2 tablet  1-2 tablet Oral Q6H PRN Black, Lesle ChrisKaren M, NP      . nystatin-triamcinolone (MYCOLOG II) cream   Topical BID Dellinger, Tora KindredMarianne L, PA-C      . ondansetron [facility](ZOFRAN) tablet 4 mg  4 mg Oral Q6H PRN Gwenyth BenderBlack, Karen M, NP       Or  . ondansetron [facility](ZOFRAN) injection 4 mg  4 mg Intravenous Q6H PRN Gwenyth BenderBlack, Karen M, NP   4 mg at 02/16/18 0448  . oxyCODONE (Oxy IR/ROXICODONE) immediate release tablet 5 mg  5 mg Oral Q4H PRN Dellinger, Clerance LavMarianne L, PA-C      . potassium chloride SA (K-DUR,KLOR-CON) CR tablet 20 mEq  20 mEq Oral BID Gwenyth BenderBlack, Karen M, NP   20 mEq at 02/16/18 0950  . senna-docusate (Senokot-S) tablet 1 tablet  1 tablet Oral QHS  PRN Black, Lesle ChrisKaren M, NP         Discharge Medications: Please see discharge summary for a list of discharge medications.  Relevant Imaging Results:  Relevant Lab Results:   Additional Information SSN  [id number]  Mearl LatinNadia S Khady Vandenberg, LCSWA

## 2018-03-21 ENCOUNTER — Telehealth: Payer: Self-pay | Admitting: Physician Assistant

## 2018-03-24 DEATH — deceased

## 2018-06-01 IMAGING — CR DG LUMBAR SPINE COMPLETE 4+V
5 series · 5 of 5 positions shown · non-contrast
Comparison: CT scan of October 11, 2006.

CLINICAL DATA: Acute low back pain after fall 2 days ago.

EXAM:
LUMBAR SPINE - COMPLETE 4+ VIEW

[t lumbar spine ap]
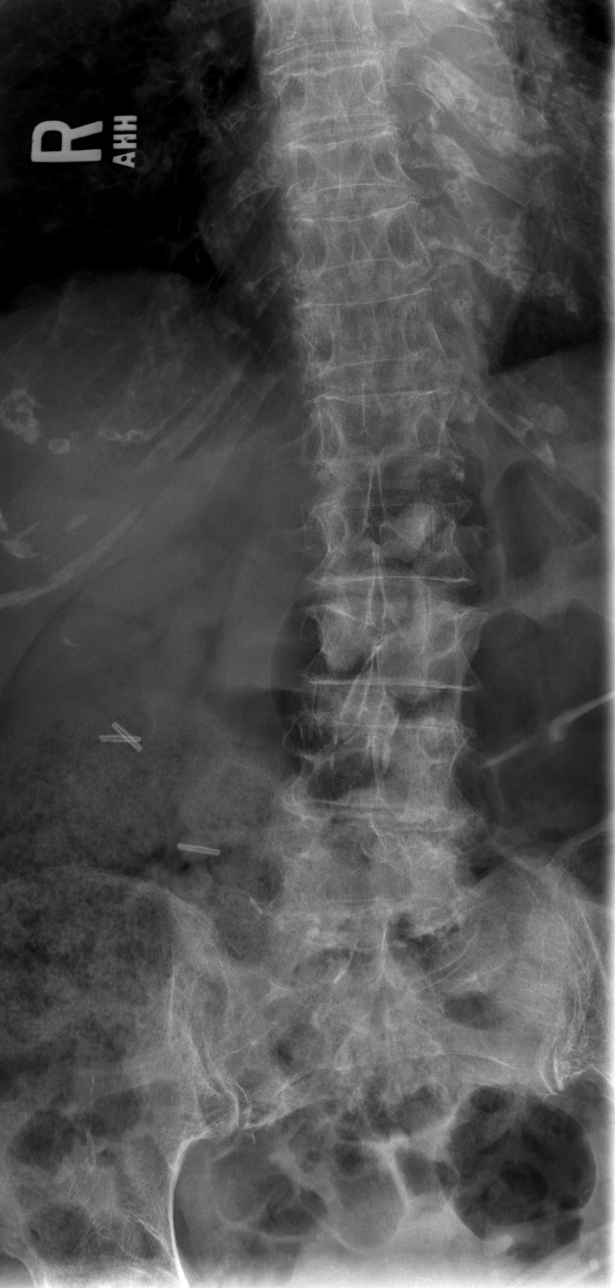

[t lumbar spine obl (1 of 2)]
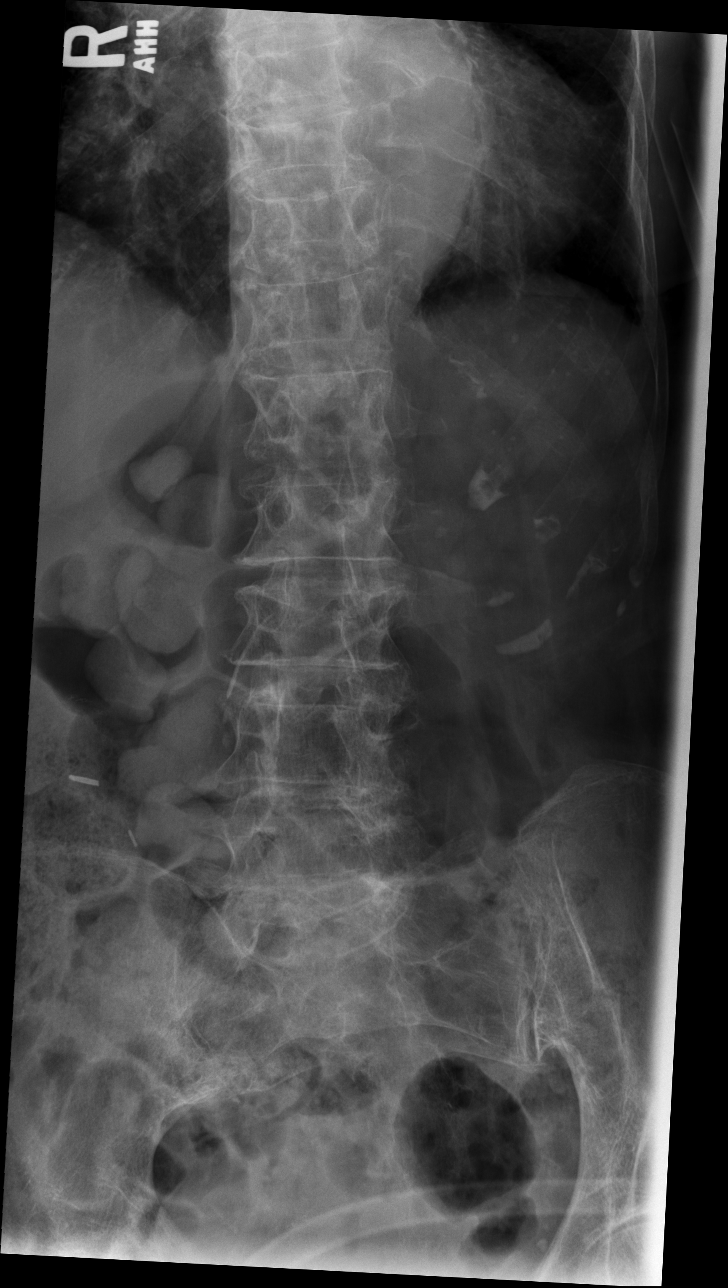

[t lumbar spine obl (2 of 2)]
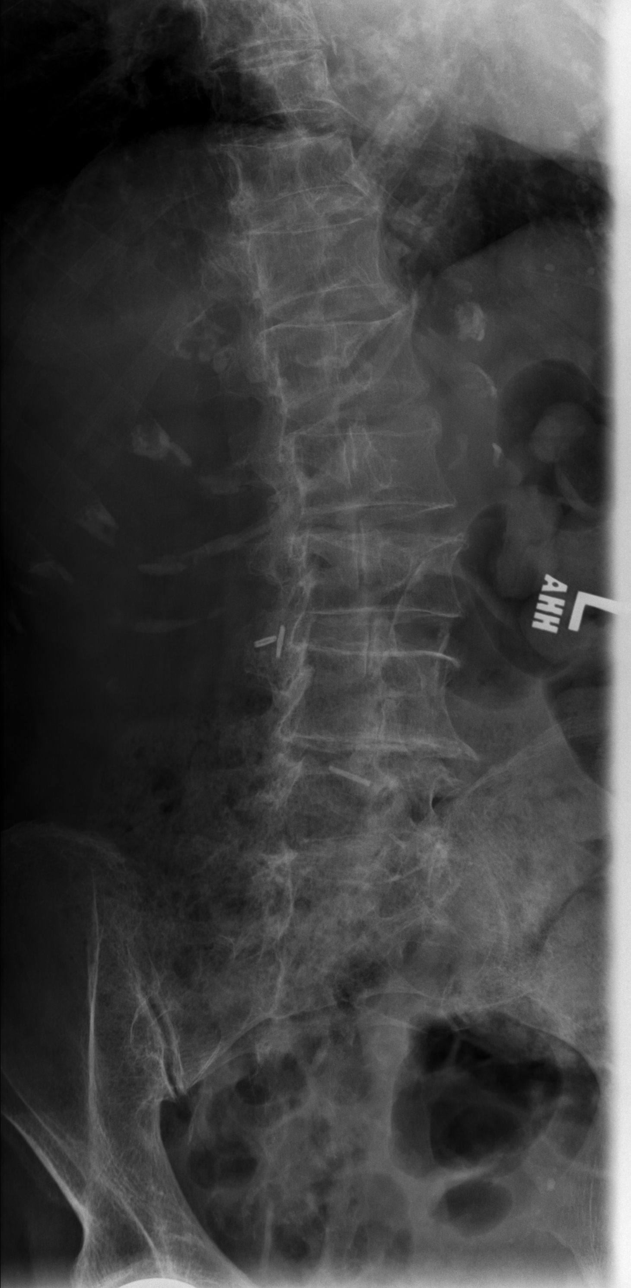

[t lumbar spine lat]
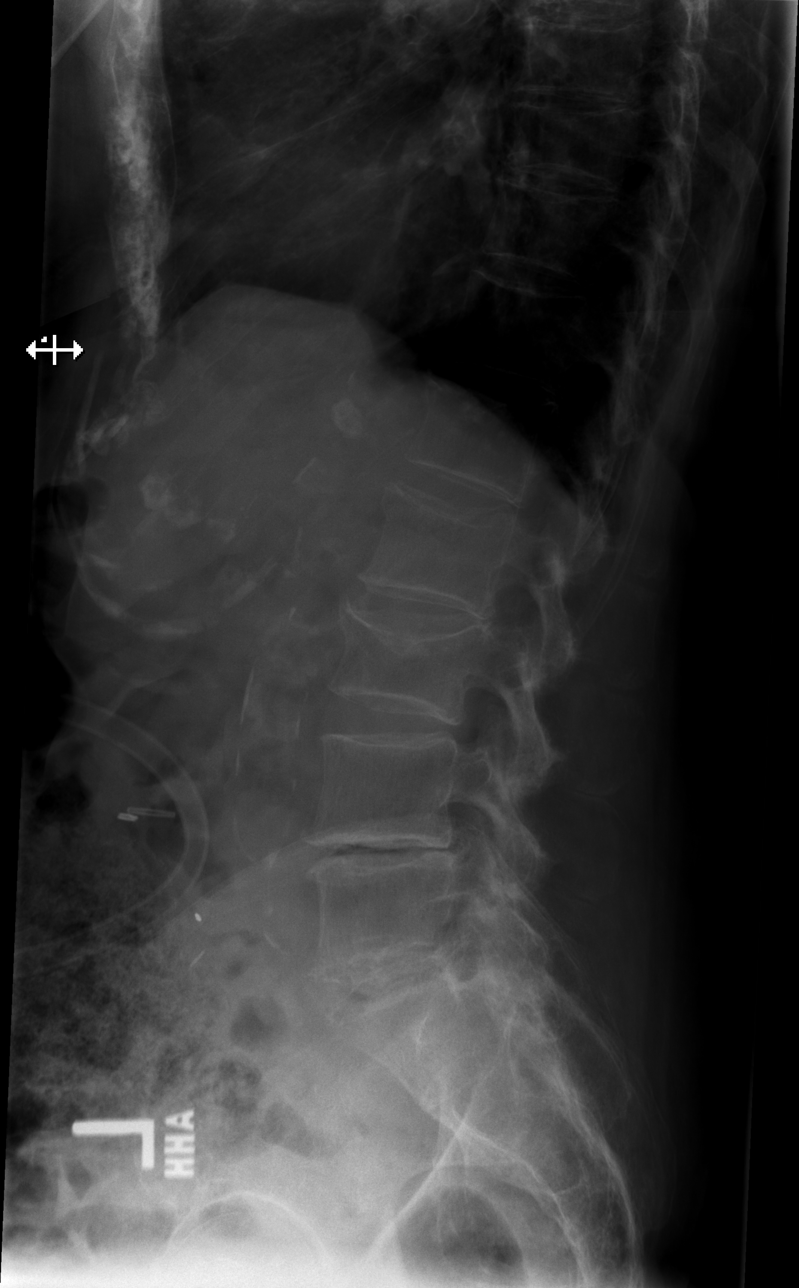

[t lumbar l-5 s-1 spot]
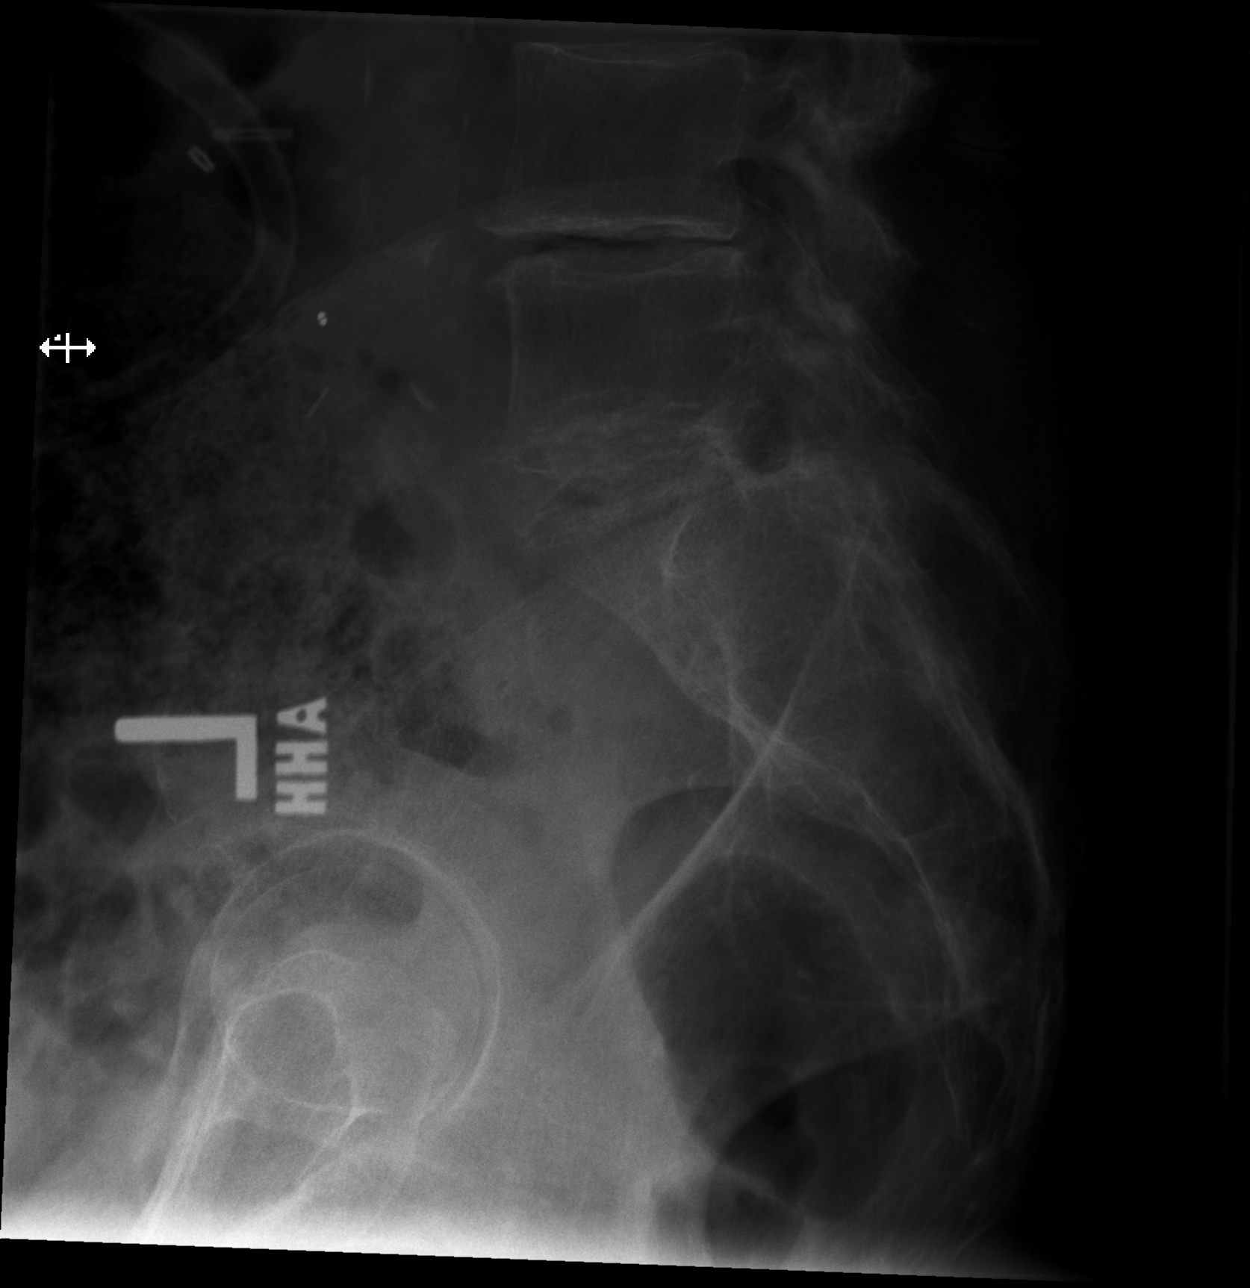

[5 of 5 positions shown; findings below may reference images not displayed]

FINDINGS: Diffuse osteopenia is noted. No spondylolisthesis is noted. Moderate
superior endplate depression of L3 vertebral body is noted most
consistent with old fracture. Moderate degenerative disc disease is
noted at L4-5 and L5-S1. Degenerative changes seen involving
posterior facet joints of L5-S1.
IMPRESSION: Probable old L3 vertebral body fracture is noted. Multilevel
degenerative disc disease. No acute abnormality seen in the lumbar
spine.

## 2018-06-01 IMAGING — CR DG CHEST 2V
2 series · 2 of 2 positions shown · non-contrast
Comparison: Radiograph February 20, 2010.

CLINICAL DATA: Shortness of breath.

EXAM:
CHEST - 2 VIEW

[w chest lat]
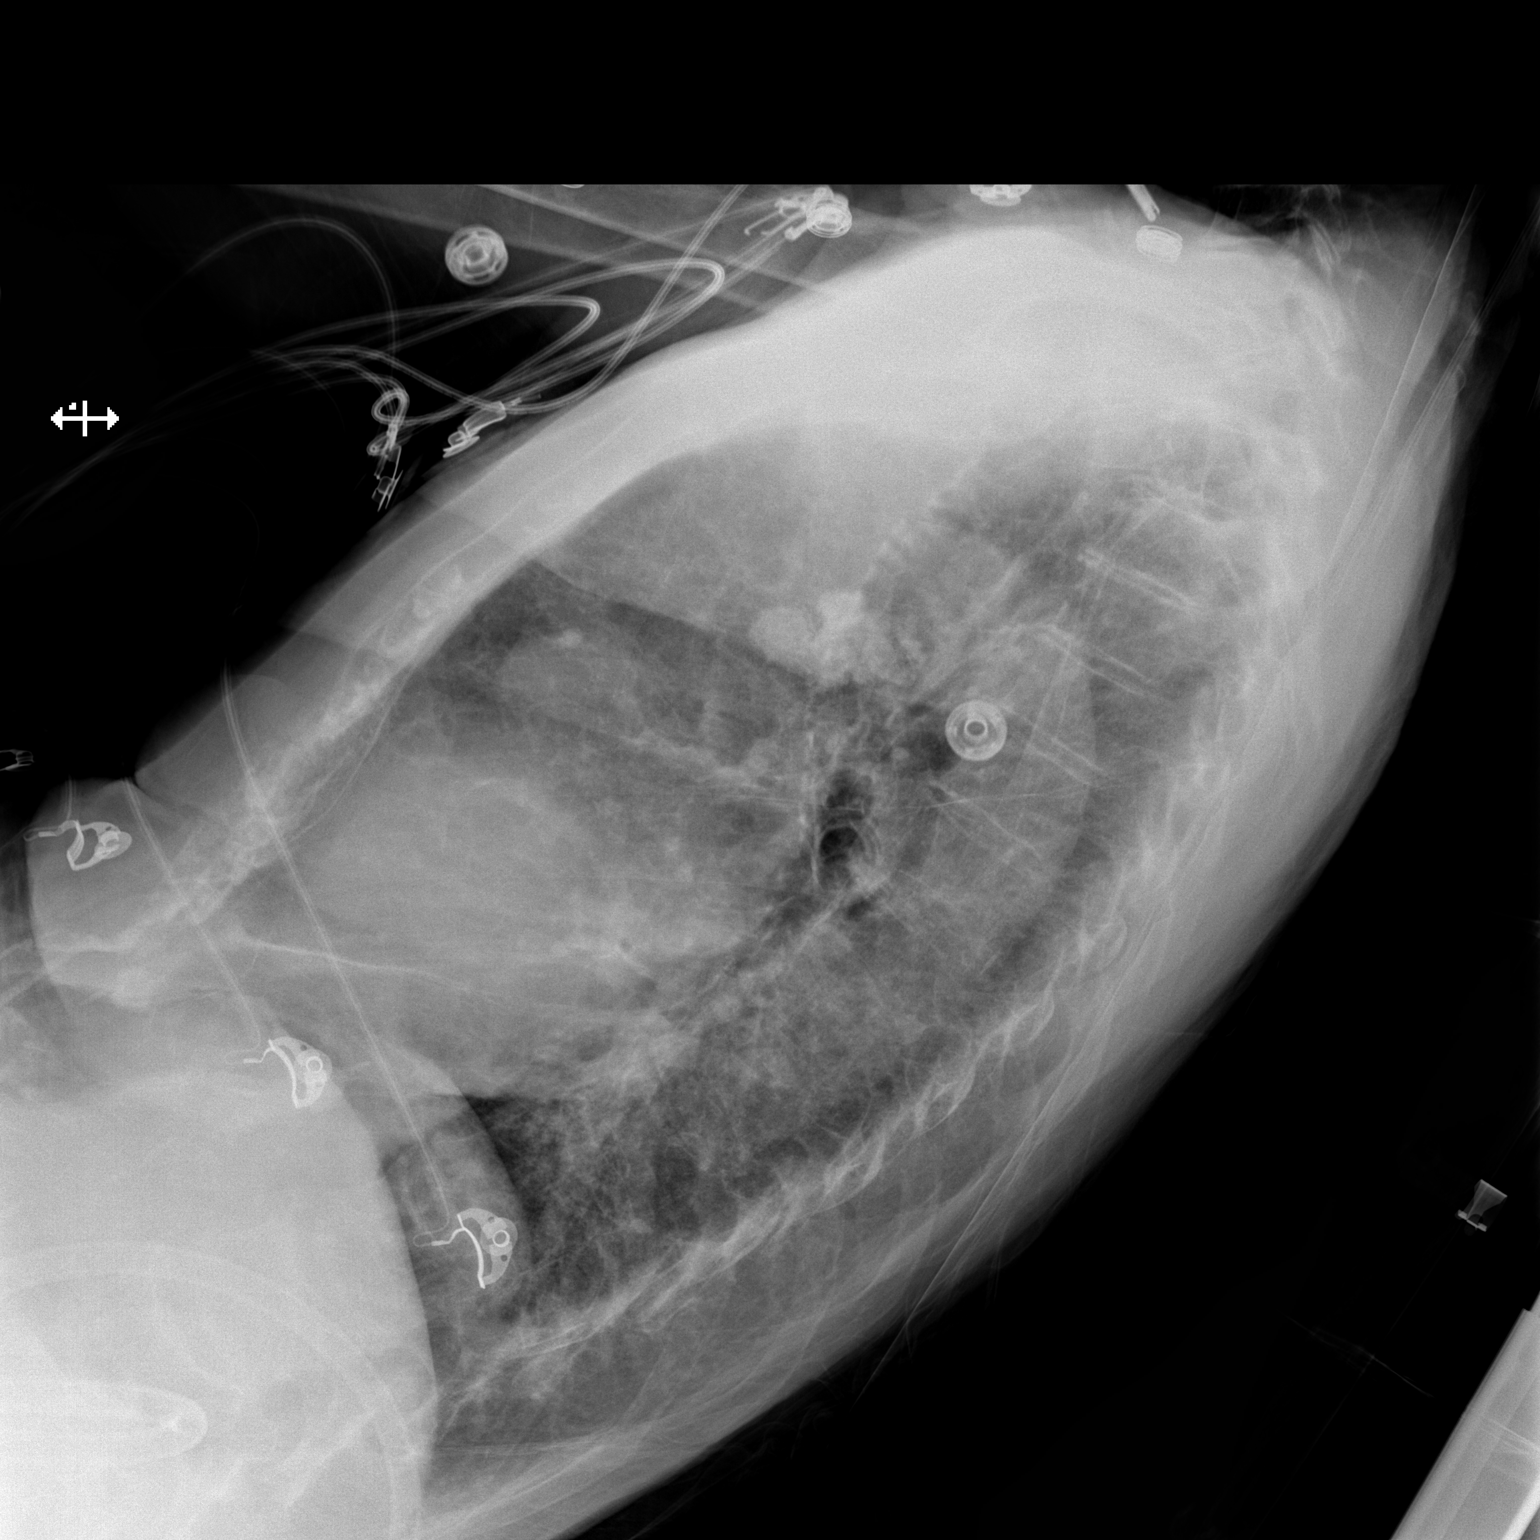

[x chest ap]
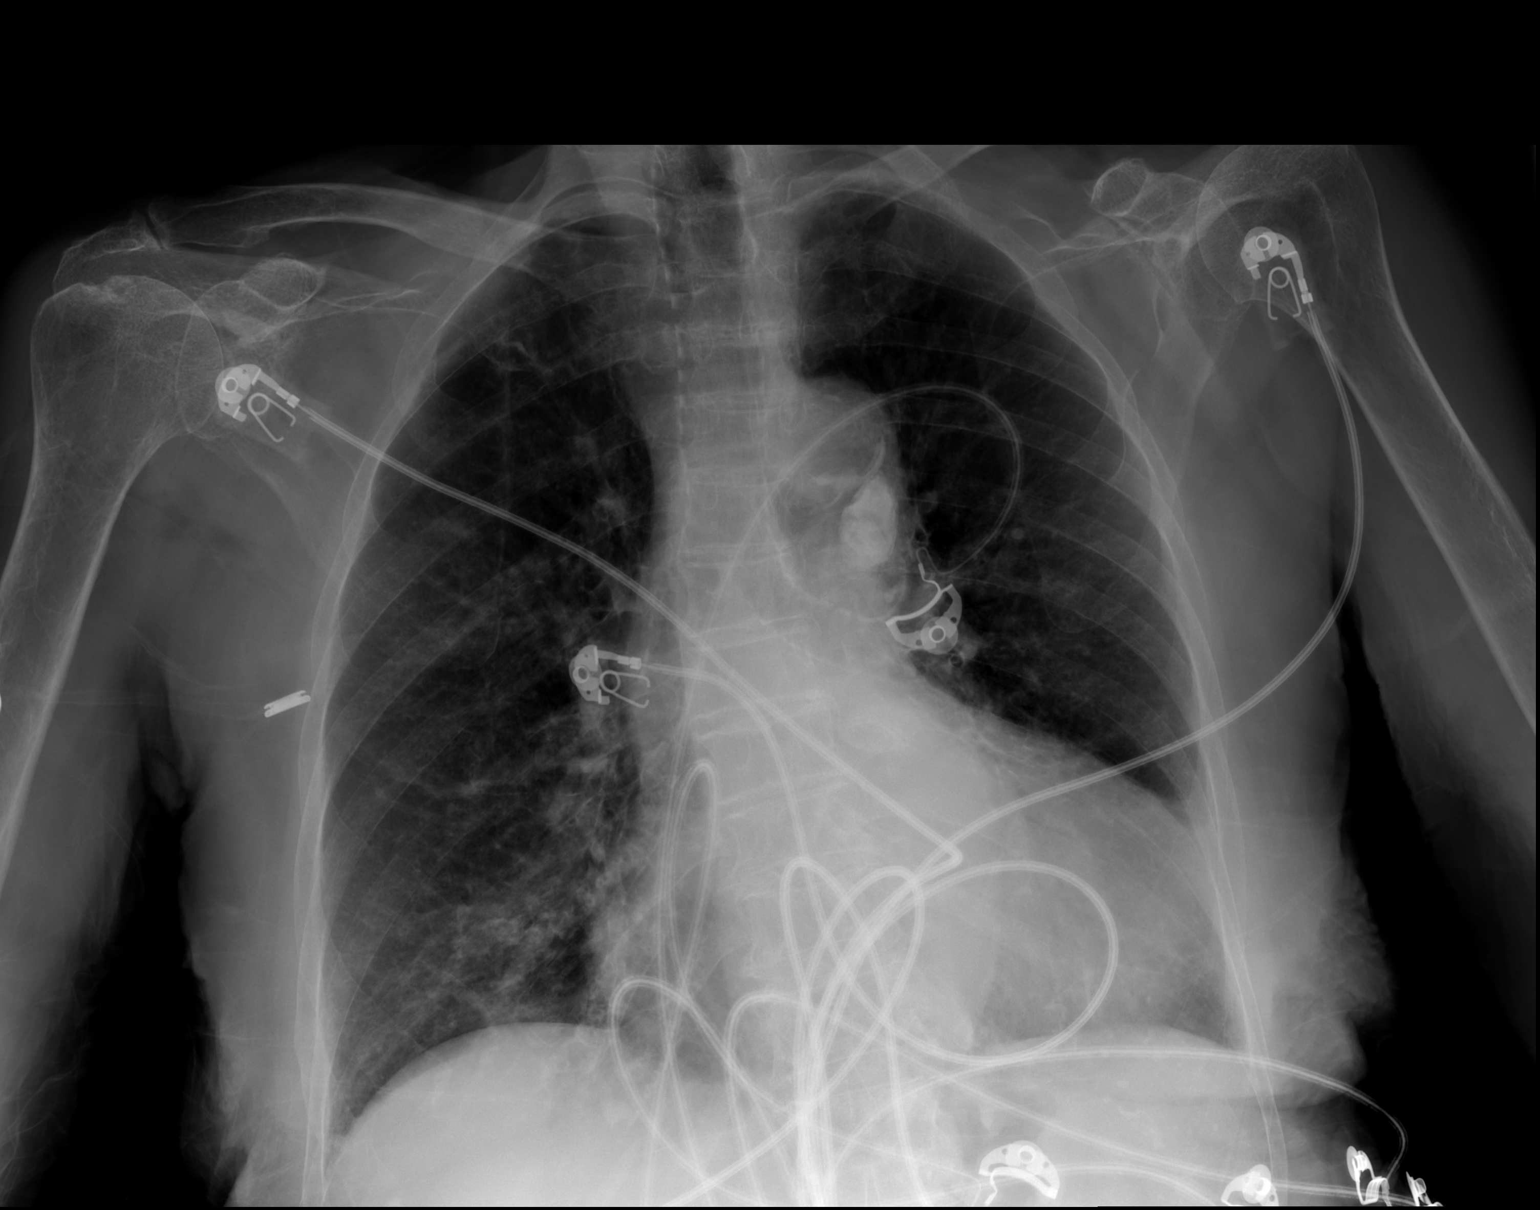

[2 of 2 positions shown; findings below may reference images not displayed]

FINDINGS: Stable cardiomegaly. Atherosclerosis of thoracic aorta is noted.
Stable calcified left hilar lymph node. No pneumothorax or pleural
effusion is noted. No acute pulmonary disease is noted. Bony thorax
is unremarkable.
IMPRESSION: No active cardiopulmonary disease.

Aortic Atherosclerosis ([id number]).
